# Patient Record
Sex: Female | Born: 1965 | Race: White | Hispanic: No | Marital: Married | Smoking: Never smoker
Health system: Southern US, Community
[De-identification: ages and names within clinical notes are randomized; demographics above are authoritative.]

## PROBLEM LIST (undated history)

## (undated) DIAGNOSIS — I10 Essential (primary) hypertension: Secondary | ICD-10-CM

## (undated) DIAGNOSIS — E079 Disorder of thyroid, unspecified: Secondary | ICD-10-CM

## (undated) DIAGNOSIS — E119 Type 2 diabetes mellitus without complications: Secondary | ICD-10-CM

## (undated) DIAGNOSIS — F988 Other specified behavioral and emotional disorders with onset usually occurring in childhood and adolescence: Secondary | ICD-10-CM

## (undated) DIAGNOSIS — E7800 Pure hypercholesterolemia, unspecified: Secondary | ICD-10-CM

## (undated) HISTORY — DX: Type 2 diabetes mellitus without complications: E11.9

## (undated) HISTORY — DX: Other specified behavioral and emotional disorders with onset usually occurring in childhood and adolescence: F98.8

## (undated) HISTORY — DX: Essential (primary) hypertension: I10

## (undated) HISTORY — PX: CERVIX SURGERY: SHX593

## (undated) HISTORY — PX: EYE SURGERY: SHX253

## (undated) HISTORY — PX: COLONOSCOPY: SHX174

## (undated) HISTORY — PX: BREAST SURGERY: SHX581

---

## 1998-01-28 ENCOUNTER — Other Ambulatory Visit: Admission: RE | Admit: 1998-01-28 | Discharge: 1998-01-28 | Payer: Self-pay | Admitting: Obstetrics and Gynecology

## 1999-02-12 ENCOUNTER — Other Ambulatory Visit: Admission: RE | Admit: 1999-02-12 | Discharge: 1999-02-12 | Payer: Self-pay | Admitting: Obstetrics and Gynecology

## 2000-03-03 ENCOUNTER — Other Ambulatory Visit: Admission: RE | Admit: 2000-03-03 | Discharge: 2000-03-03 | Payer: Self-pay | Admitting: Obstetrics and Gynecology

## 2000-10-18 ENCOUNTER — Encounter: Admission: RE | Admit: 2000-10-18 | Discharge: 2000-11-23 | Payer: Self-pay | Admitting: Obstetrics and Gynecology

## 2001-01-09 ENCOUNTER — Inpatient Hospital Stay (HOSPITAL_COMMUNITY): Admission: AD | Admit: 2001-01-09 | Discharge: 2001-01-09 | Payer: Self-pay | Admitting: *Deleted

## 2001-01-12 ENCOUNTER — Inpatient Hospital Stay (HOSPITAL_COMMUNITY): Admission: AD | Admit: 2001-01-12 | Discharge: 2001-01-14 | Payer: Self-pay | Admitting: Obstetrics and Gynecology

## 2001-01-26 ENCOUNTER — Encounter: Admission: RE | Admit: 2001-01-26 | Discharge: 2001-02-25 | Payer: Self-pay | Admitting: Obstetrics and Gynecology

## 2001-02-26 ENCOUNTER — Encounter: Admission: RE | Admit: 2001-02-26 | Discharge: 2001-03-28 | Payer: Self-pay | Admitting: Obstetrics and Gynecology

## 2001-02-28 ENCOUNTER — Other Ambulatory Visit: Admission: RE | Admit: 2001-02-28 | Discharge: 2001-02-28 | Payer: Self-pay | Admitting: Obstetrics and Gynecology

## 2001-04-28 ENCOUNTER — Encounter: Admission: RE | Admit: 2001-04-28 | Discharge: 2001-05-28 | Payer: Self-pay | Admitting: Obstetrics and Gynecology

## 2001-05-29 ENCOUNTER — Encounter: Admission: RE | Admit: 2001-05-29 | Discharge: 2001-06-28 | Payer: Self-pay | Admitting: Obstetrics and Gynecology

## 2002-04-09 ENCOUNTER — Other Ambulatory Visit: Admission: RE | Admit: 2002-04-09 | Discharge: 2002-04-09 | Payer: Self-pay | Admitting: Obstetrics and Gynecology

## 2002-12-25 ENCOUNTER — Encounter: Admission: RE | Admit: 2002-12-25 | Discharge: 2003-03-25 | Payer: Self-pay | Admitting: Obstetrics and Gynecology

## 2003-04-23 ENCOUNTER — Other Ambulatory Visit: Admission: RE | Admit: 2003-04-23 | Discharge: 2003-04-23 | Payer: Self-pay | Admitting: Obstetrics and Gynecology

## 2004-05-13 ENCOUNTER — Other Ambulatory Visit: Admission: RE | Admit: 2004-05-13 | Discharge: 2004-05-13 | Payer: Self-pay | Admitting: Obstetrics and Gynecology

## 2005-05-31 ENCOUNTER — Other Ambulatory Visit: Admission: RE | Admit: 2005-05-31 | Discharge: 2005-05-31 | Payer: Self-pay | Admitting: Obstetrics and Gynecology

## 2012-01-29 ENCOUNTER — Encounter (HOSPITAL_COMMUNITY): Payer: Self-pay | Admitting: *Deleted

## 2012-01-29 ENCOUNTER — Emergency Department (INDEPENDENT_AMBULATORY_CARE_PROVIDER_SITE_OTHER)
Admission: EM | Admit: 2012-01-29 | Discharge: 2012-01-29 | Disposition: A | Discharge status: Home / Self Care | Payer: Managed Care, Other (non HMO) | Source: Home / Self Care | Attending: Emergency Medicine | Admitting: Emergency Medicine

## 2012-01-29 DIAGNOSIS — K122 Cellulitis and abscess of mouth: Secondary | ICD-10-CM

## 2012-01-29 HISTORY — DX: Pure hypercholesterolemia, unspecified: E78.00

## 2012-01-29 HISTORY — DX: Disorder of thyroid, unspecified: E07.9

## 2012-01-29 MED ORDER — ACETAMINOPHEN-CODEINE #3 300-30 MG PO TABS: 1.0000 | ORAL_TABLET | Freq: Four times a day (QID) | ORAL | Status: DC | PRN

## 2012-01-29 MED ORDER — PREDNISONE 10 MG PO TABS: 20.0000 mg | ORAL_TABLET | Freq: Every day | ORAL | Status: AC

## 2012-01-29 MED ORDER — AMOXICILLIN-POT CLAVULANATE 500-125 MG PO TABS: 1.0000 | ORAL_TABLET | Freq: Three times a day (TID) | ORAL | Status: AC

## 2012-01-29 NOTE — ED Provider Notes (Signed)
History     CSN: [id number]  Arrival date & time 01/29/12  1721   First MD Initiated Contact with Patient 01/29/12 1740      Chief Complaint  Patient presents with  . Sore Throat    (Consider location/radiation/quality/duration/timing/severity/associated sxs/prior treatment) HPI Comments: Pt  Reports  Symptoms  Of  sorethroat  With  Pain on  Swallowing  As  Well  As  What  She  descibed  As  Sores  On uvula . Patient was trying with salt water gargles along with peroxide. Put pain seemed to have gotten worse in the last 2 days. She was told that this could be a streptococcal infection. Although patient describes that she has not had any fevers is not congested and does not have of any "lymph nodes on her neck".  Patient is a 46 y.o. female presenting with pharyngitis. The history is provided by the patient.  Sore Throat This is a new problem. The current episode started more than 2 days ago. The problem occurs constantly. The problem has been gradually worsening. Pertinent negatives include no abdominal pain, no headaches and no shortness of breath. The symptoms are aggravated by swallowing. Nothing relieves the symptoms. Treatments tried: Patient was trying salt water gargles along with some peroxide.    Past Medical History  Diagnosis Date  . Thyroid disorder   . Hypercholesterolemia     Past Surgical History  Procedure Date  . Cervix surgery     No family history on file.  History  Substance Use Topics  . Smoking status: Never Smoker   . Smokeless tobacco: Not on file  . Alcohol Use: No    OB History    Grav Para Term Preterm Abortions TAB SAB Ect Mult Living                  Review of Systems  Constitutional: Negative for fever, chills, activity change, appetite change and fatigue.  HENT: Positive for sore throat and trouble swallowing. Negative for ear pain, congestion, rhinorrhea, neck pain, postnasal drip, sinus pressure and ear discharge.   Respiratory:  Negative for shortness of breath.   Gastrointestinal: Negative for nausea and abdominal pain.  Musculoskeletal: Negative for myalgias and arthralgias.  Skin: Negative for rash.  Neurological: Negative for headaches.    Allergies  Review of patient's allergies indicates not on file.  Home Medications   Current Outpatient Rx  Name Route Sig Dispense Refill  . LIPITOR PO Oral Take by mouth.    . SYNTHROID PO Oral Take by mouth.    . ACETAMINOPHEN-CODEINE #3 300-30 MG PO TABS Oral Take 1-2 tablets by mouth every 6 (six) hours as needed for pain. 15 tablet 0  . AMOXICILLIN-POT CLAVULANATE 500-125 MG PO TABS Oral Take 1 tablet (500 mg total) by mouth 3 (three) times daily. 30 tablet 0  . PREDNISONE 10 MG PO TABS Oral Take 2 tablets (20 mg total) by mouth daily. 15 tablet 0    BP 150/99  Pulse 108  Temp 98.8 F (37.1 C) (Oral)  Resp 20  SpO2 100%  Physical Exam  Nursing note and vitals reviewed. Constitutional: She appears well-developed and well-nourished. No distress.  HENT:  Right Ear: Tympanic membrane normal.  Left Ear: Tympanic membrane normal.  Mouth/Throat: Mucous membranes are normal. Uvula swelling present. Posterior oropharyngeal erythema present. No oropharyngeal exudate or tonsillar abscesses.    Eyes: Conjunctivae normal are normal.  Neck: Neck supple. No JVD present. No thyromegaly present.  Abdominal: Soft. There is no tenderness.  Lymphadenopathy:    She has no cervical adenopathy.  Neurological: She is alert.  Skin: No rash noted. No erythema.    ED Course  Procedures (including critical care time)   Labs Reviewed  POCT RAPID STREP A ([facility] ONLY)   No results found.   1. Uvulitis       MDM   Uvulitis patient was prescribed Augmentin, prednisone and Tylenol #3 for pain and discomfort. Otherwise to return in 3 days if no significant improvement we also discuss symptoms that should warrant her more probably evaluation return patient agrees  with treatment plan and followup care      Jimmie Molly, MD 01/29/12 2021

## 2012-01-29 NOTE — ED Notes (Signed)
Pt  Reports  Symptoms  Of  sorethroat  With  Pain on  Swallowing  As  Well  As  What  She  descibed  As  Sores  On uvula     She  Is  Sitting  Upright on  Exam table  She  Is  Speaking in  Complete  sentances  And  Is  In no  Severe  Distress

## 2012-08-28 ENCOUNTER — Encounter: Payer: Self-pay | Admitting: Family Medicine

## 2012-08-28 ENCOUNTER — Ambulatory Visit (INDEPENDENT_AMBULATORY_CARE_PROVIDER_SITE_OTHER): Payer: 59 | Admitting: Family Medicine

## 2012-08-28 VITALS — BP 140/100 | HR 92 | Temp 98.5°F | Resp 16 | Ht 63.5 in | Wt 222.0 lb

## 2012-08-28 DIAGNOSIS — E785 Hyperlipidemia, unspecified: Secondary | ICD-10-CM

## 2012-08-28 DIAGNOSIS — I10 Essential (primary) hypertension: Secondary | ICD-10-CM | POA: Insufficient documentation

## 2012-08-28 DIAGNOSIS — E039 Hypothyroidism, unspecified: Secondary | ICD-10-CM

## 2012-08-28 DIAGNOSIS — E079 Disorder of thyroid, unspecified: Secondary | ICD-10-CM | POA: Insufficient documentation

## 2012-08-28 LAB — BASIC METABOLIC PANEL
BUN: 12 mg/dL (ref 6–23)
CO2: 28 mEq/L (ref 19–32)
Chloride: 100 mEq/L (ref 96–112)
Creat: 0.75 mg/dL (ref 0.50–1.10)
Glucose, Bld: 104 mg/dL — ABNORMAL HIGH (ref 70–99)

## 2012-08-28 LAB — HEPATIC FUNCTION PANEL
ALT: 40 U/L — ABNORMAL HIGH (ref 0–35)
AST: 21 U/L (ref 0–37)
Total Protein: 7.4 g/dL (ref 6.0–8.3)

## 2012-08-28 LAB — LIPID PANEL
Cholesterol: 234 mg/dL — ABNORMAL HIGH (ref 0–200)
Triglycerides: 270 mg/dL — ABNORMAL HIGH (ref <150)

## 2012-08-28 MED ORDER — LOSARTAN POTASSIUM-HCTZ 50-12.5 MG PO TABS: 1.0000 | ORAL_TABLET | Freq: Every day | ORAL | Status: DC

## 2012-08-28 NOTE — Progress Notes (Signed)
Subjective:    Patient ID: Samantha Glover, female    DOB: Jun 11, 1965, 47 y.o.   MRN: [id number]  HPI Patient is here today to establish. She is a very pleasant female. She normally sees Dr. Arelia Sneddon, her gynecologist. He discovered her blood pressure was significantly elevated at 150/90. He started her on hydrochlorothiazide 12.5 mg by mouth daily. She's been taking this for over a month. Her blood pressures continue to remain elevated at 140/100. She denies any chest pain or shortness of breath. She does report significant fatigue. She's recently gained a lot of weight. She snores very loudly. She is always tired. She falls asleep very easily throughout the day. Her husband has witnessed an occasional apneic episode.  She has had a history of hypothyroidism and hyperlipidemia. However she has not been taking the Lipitor and levothyroxine her previous PCP had prescribed. Past Medical History  Diagnosis Date  . Hypercholesterolemia   . Hypertension   . Thyroid disorder     hypothyroidism   Current outpatient prescriptions:hydrochlorothiazide (MICROZIDE) 12.5 MG capsule, Take 12.5 mg by mouth daily., Disp: , Rfl: ;  triamcinolone (NASACORT) 55 MCG/ACT nasal inhaler, Place 2 sprays into the nose daily., Disp: , Rfl: ;  losartan-hydrochlorothiazide (HYZAAR) 50-12.5 MG per tablet, Take 1 tablet by mouth daily., Disp: 30 tablet, Rfl: 5  No Known Allergies History   Social History  . Marital Status: Married    Spouse Name: N/A    Number of Children: N/A  . Years of Education: N/A   Occupational History  . Not on file.   Social History Main Topics  . Smoking status: Former Games developer  . Smokeless tobacco: Never Used  . Alcohol Use: No  . Drug Use: No  . Sexually Active: Not on file     Comment: married.  2 sons.  Currently, stay at home mom.   Other Topics Concern  . Not on file   Social History Narrative  . No narrative on file   Family History  Problem Relation Age of Onset  .  Hyperlipidemia Mother   . Hypertension Mother   . Hyperlipidemia Sister   . Hypertension Sister   . Hyperlipidemia Sister   . Hypertension Sister        Review of Systems Review of systems is otherwise    Objective:   Physical Exam  Constitutional: She is oriented to person, place, and time. She appears well-developed and well-nourished.  HENT:  Head: Normocephalic.  Mouth/Throat: Oropharynx is clear and moist.  Neck: Neck supple. No JVD present. No thyromegaly present.  Cardiovascular: Normal rate, regular rhythm, normal heart sounds and intact distal pulses.  Exam reveals no gallop.   No murmur heard. Pulmonary/Chest: Effort normal and breath sounds normal. No respiratory distress. She has no wheezes. She has no rales. She exhibits no tenderness.  Abdominal: Soft. Bowel sounds are normal. She exhibits no distension and no mass. There is no tenderness. There is no rebound and no guarding.  Lymphadenopathy:    She has no cervical adenopathy.  Neurological: She is alert and oriented to person, place, and time. No cranial nerve deficit. She exhibits normal muscle tone. Coordination normal.  Skin: No rash noted. No erythema.  Psychiatric: She has a normal mood and affect. Her behavior is normal. Judgment and thought content normal.          Assessment & Plan:  1. HTN (hypertension) Discontinue hydrochlorothiazide. Begin Hyzaar 50/12.5 one by mouth daily. Recheck blood pressure in one month along with  a BMP to evaluate for subclinical renal artery stenosis.  I strongly recommended a sleep study to evaluate for possible obstructive sleep apnea. The patient will discuss with the family, consider, and let me know her wishes when I call her with her laboratory values. - losartan-hydrochlorothiazide (HYZAAR) 50-12.5 MG per tablet; Take 1 tablet by mouth daily.  Dispense: 30 tablet; Refill: 5 - Basic Metabolic Panel - Hepatic Function Panel - Lipid Panel  2. Unspecified  hypothyroidism Given her obesity and fatigue, check TSH - TSH  3. HLD (hyperlipidemia) Go LDL is less than 130

## 2012-08-29 MED ORDER — ATORVASTATIN CALCIUM 20 MG PO TABS: 20.0000 mg | ORAL_TABLET | Freq: Every day | ORAL | Status: DC

## 2012-08-29 NOTE — Addendum Note (Signed)
Addended by: Lynnea Ferrier on: 08/29/2012 07:34 AM   Modules accepted: Orders

## 2012-08-31 ENCOUNTER — Encounter: Payer: Self-pay | Admitting: Family Medicine

## 2012-08-31 NOTE — Telephone Encounter (Signed)
This encounter was created in error - please disregard.

## 2012-11-14 ENCOUNTER — Other Ambulatory Visit: Payer: Self-pay | Admitting: Family Medicine

## 2012-11-14 DIAGNOSIS — Z79899 Other long term (current) drug therapy: Secondary | ICD-10-CM

## 2012-11-14 DIAGNOSIS — E782 Mixed hyperlipidemia: Secondary | ICD-10-CM

## 2012-11-20 ENCOUNTER — Other Ambulatory Visit: Payer: BC Managed Care – PPO

## 2012-11-20 DIAGNOSIS — R7309 Other abnormal glucose: Secondary | ICD-10-CM

## 2012-11-20 DIAGNOSIS — E782 Mixed hyperlipidemia: Secondary | ICD-10-CM

## 2012-11-20 DIAGNOSIS — Z79899 Other long term (current) drug therapy: Secondary | ICD-10-CM

## 2012-11-20 LAB — COMPREHENSIVE METABOLIC PANEL
AST: 18 U/L (ref 0–37)
Albumin: 4.2 g/dL (ref 3.5–5.2)
BUN: 10 mg/dL (ref 6–23)
Calcium: 9.2 mg/dL (ref 8.4–10.5)
Chloride: 102 mEq/L (ref 96–112)
Glucose, Bld: 173 mg/dL — ABNORMAL HIGH (ref 70–99)
Potassium: 4.2 mEq/L (ref 3.5–5.3)
Sodium: 137 mEq/L (ref 135–145)
Total Protein: 7 g/dL (ref 6.0–8.3)

## 2012-11-20 LAB — LIPID PANEL
HDL: 31 mg/dL — ABNORMAL LOW (ref >39)
LDL Cholesterol: 78 mg/dL (ref 0–99)
Triglycerides: 238 mg/dL — ABNORMAL HIGH (ref <150)

## 2012-11-21 LAB — HEMOGLOBIN A1C, FINGERSTICK: Hgb A1C (fingerstick): 6.6 % — ABNORMAL HIGH (ref <5.7)

## 2012-11-23 ENCOUNTER — Ambulatory Visit (INDEPENDENT_AMBULATORY_CARE_PROVIDER_SITE_OTHER): Payer: Managed Care, Other (non HMO) | Admitting: Family Medicine

## 2012-11-23 ENCOUNTER — Encounter: Payer: Self-pay | Admitting: Family Medicine

## 2012-11-23 VITALS — BP 122/82 | HR 78 | Temp 98.5°F | Resp 16 | Wt 228.0 lb

## 2012-11-23 DIAGNOSIS — I10 Essential (primary) hypertension: Secondary | ICD-10-CM

## 2012-11-23 DIAGNOSIS — E785 Hyperlipidemia, unspecified: Secondary | ICD-10-CM

## 2012-11-23 DIAGNOSIS — E7800 Pure hypercholesterolemia, unspecified: Secondary | ICD-10-CM | POA: Insufficient documentation

## 2012-11-23 DIAGNOSIS — E119 Type 2 diabetes mellitus without complications: Secondary | ICD-10-CM | POA: Insufficient documentation

## 2012-11-23 MED ORDER — LOSARTAN POTASSIUM 50 MG PO TABS: 50.0000 mg | ORAL_TABLET | Freq: Every day | ORAL | Status: DC

## 2012-11-23 NOTE — Progress Notes (Signed)
Subjective:    Patient ID: Samantha Glover, female    DOB: 1966/01/26, 47 y.o.   MRN: [id number]  HPI Patient is here today for followup of her hypertension and her hyperlipidemia. Currently she is taking Hyzaar 50/12.5 one by mouth daily for hypertension. Her blood pressure is well-controlled at 122/82. She is also taking Lipitor 20 mg by mouth daily for her cholesterol. Her most recent lab work is listed below. She denies chest pain, shortness of breath, dyspnea on exertion, right upper quadrant pain, or myalgias. Past Medical History  Diagnosis Date  . Diabetes mellitus without complication   . Hypercholesterolemia   . Hypertension   . Thyroid disorder     hypothyroidism   Current Outpatient Prescriptions on File Prior to Visit  Medication Sig Dispense Refill  . atorvastatin (LIPITOR) 20 MG tablet Take 1 tablet (20 mg total) by mouth daily.  90 tablet  3  . triamcinolone (NASACORT) 55 MCG/ACT nasal inhaler Place 2 sprays into the nose daily.       No current facility-administered medications on file prior to visit.   No Known Allergies History   Social History  . Marital Status: Married    Spouse Name: N/A    Number of Children: N/A  . Years of Education: N/A   Occupational History  . Not on file.   Social History Main Topics  . Smoking status: Former Games developer  . Smokeless tobacco: Never Used  . Alcohol Use: No  . Drug Use: No  . Sexually Active: Not on file     Comment: married.  2 sons.  Currently, stay at home mom.   Other Topics Concern  . Not on file   Social History Narrative  . No narrative on file   Lab on 11/20/2012  Component Date Value Range Status  . Sodium 11/20/2012 137  135 - 145 mEq/L Final  . Potassium 11/20/2012 4.2  3.5 - 5.3 mEq/L Final  . Chloride 11/20/2012 102  96 - 112 mEq/L Final  . CO2 11/20/2012 25  19 - 32 mEq/L Final  . Glucose, Bld 11/20/2012 173* 70 - 99 mg/dL Final  . BUN 40/98/1191 10  6 - 23 mg/dL Final  . Creat 47/82/9562 0.68   0.50 - 1.10 mg/dL Final  . Total Bilirubin 11/20/2012 0.5  0.3 - 1.2 mg/dL Final  . Alkaline Phosphatase 11/20/2012 68  39 - 117 U/L Final  . AST 11/20/2012 18  0 - 37 U/L Final  . ALT 11/20/2012 32  0 - 35 U/L Final  . Total Protein 11/20/2012 7.0  6.0 - 8.3 g/dL Final  . Albumin 13/11/6576 4.2  3.5 - 5.2 g/dL Final  . Calcium 46/96/2952 9.2  8.4 - 10.5 mg/dL Final  . Cholesterol 84/13/2440 157  0 - 200 mg/dL Final   Comment: ATP III Classification:                                < 200        mg/dL        Desirable                               200 - 239     mg/dL        Borderline High                               >=  240        mg/dL        High                             . Triglycerides 11/20/2012 238* <150 mg/dL Final  . HDL 14/78/2956 31* >39 mg/dL Final  . Total CHOL/HDL Ratio 11/20/2012 5.1   Final  . VLDL 11/20/2012 48* 0 - 40 mg/dL Final  . LDL Cholesterol 11/20/2012 78  0 - 99 mg/dL Final   Comment:                            Total Cholesterol/HDL Ratio:CHD Risk                                                 Coronary Heart Disease Risk Table                                                                 Men       Women                                   1/2 Average Risk              3.4        3.3                                       Average Risk              5.0        4.4                                    2X Average Risk              9.6        7.1                                    3X Average Risk             23.4       11.0                          Use the calculated Patient Ratio above and the CHD Risk table                           to determine the patient's CHD Risk.                          ATP III Classification (LDL):                                <  100        mg/dL         Optimal                               100 - 129     mg/dL         Near or Above Optimal                               130 - 159     mg/dL         Borderline High                                160 - 189     mg/dL         High                                > 190        mg/dL         Very High                             . Hgb A1C (fingerstick) 11/20/2012 6.6* <5.7 % Final   Comment:                                                                                                 According to the ADA Clinical Practice Recommendations for 2011, when                          HbA1c is used as a screening test:                                                       >=6.5%   Diagnostic of Diabetes Mellitus                                     (if abnormal result is confirmed)                                                     5.7-6.4%   Increased risk of developing Diabetes Mellitus  References:Diagnosis and Classification of Diabetes Mellitus,Diabetes                          Care,2011,34(Suppl 1):S62-S69 and Standards of Medical Care in                                  Diabetes - 2011,Diabetes Care,2011,34 (Suppl 1):S11-S61.                              Labs continued 2 showed elevated triglycerides at 238, low HDL at 31, and unfortunately a hemoglobin A1c of 6.6 with a fasting blood sugar of 178.   Review of Systems  All other systems reviewed and are negative.       Objective:   Physical Exam  Vitals reviewed. Neck: Neck supple. No thyromegaly present.  Cardiovascular: Normal rate, regular rhythm, normal heart sounds and intact distal pulses.   No murmur heard. Pulmonary/Chest: Effort normal and breath sounds normal. No respiratory distress. She has no wheezes. She has no rales.  Abdominal: Soft. Bowel sounds are normal. She exhibits no distension and no mass. There is no tenderness. There is no rebound and no guarding.          Assessment & Plan:  1. Type II or unspecified type diabetes mellitus without mention of complication, not stated as uncontrolled This is a new diagnosis.  Spent approximately 20 minutes  discussing diet, lifestyle, exercise, and implications of her lab work. She will begin a low carbohydrate diet and gradually increase aerobic exercise. I am going to set her up to see a nutritionist for diabetic education. We will recheck a fasting lipid panel and hemoglobin A1c in 3 months the  2. HTN (hypertension) Blood pressure is well controlled. However given her elevated triglycerides as well as her elevated blood sugar, I will discontinue the hydrochlorothiazide portion of Hyzaar. Begin losartan 50 mg by mouth daily  3. HLD (hyperlipidemia) Work on diet to address triglycerides. We did discuss the possible increase in her blood sugar due to the Lipitor.  I feel the effect is likely small. Recheck labs in 3 months. If sugars continue to increase we may want to discontinue Lipitor.

## 2012-12-21 ENCOUNTER — Encounter: Payer: Managed Care, Other (non HMO) | Attending: Family Medicine

## 2012-12-21 VITALS — Ht 63.0 in | Wt 220.8 lb

## 2012-12-21 DIAGNOSIS — Z713 Dietary counseling and surveillance: Secondary | ICD-10-CM | POA: Insufficient documentation

## 2012-12-21 DIAGNOSIS — E119 Type 2 diabetes mellitus without complications: Secondary | ICD-10-CM

## 2012-12-21 NOTE — Patient Instructions (Signed)
Goals:  Follow Diabetes Meal Plan as instructed  Eat 3 meals and 2 snacks, every 3-5 hrs  Limit carbohydrate intake to 30-45 grams carbohydrate/meal  Limit carbohydrate intake to 15 grams carbohydrate/snack  Add lean protein foods to meals/snacks  Monitor glucose levels as instructed by your doctor  Aim for 30 mins of physical activity daily  Bring food record and glucose log to your next nutrition visit 

## 2012-12-21 NOTE — Progress Notes (Signed)
Patient was seen on 12/21/12 for the first of a series of three diabetes self-management courses at the [facility].   Current HbA1c: 6.6%  The following learning objectives were met by the patient during this course:   Defines the role of glucose and insulin  Identifies type of diabetes and pathophysiology  Defines the diagnostic criteria for diabetes and prediabetes  States the risk factors for Type 2 Diabetes  States the symptoms of Type 2 Diabetes  Defines Type 2 Diabetes treatment goals  Defines Type 2 Diabetes treatment options  States the rationale for glucose monitoring  Identifies A1C, glucose targets, and testing times  Identifies proper sharps disposal  Defines the purpose of a diabetes food plan  Identifies carbohydrate food groups  Defines effects of carbohydrate foods on glucose levels  Identifies carbohydrate choices/grams/food labels  States benefits of physical activity and effect on glucose  Review of suggested activity guidelines  Handouts given during class include:  Type 2 Diabetes: Basics Book  My Food Plan Book  Food and Activity Log  Your patient has identified their diabetes self-care support plan as:  [facility] support group  Follow-Up Plan: Attend core 2 and core 3   

## 2013-02-23 ENCOUNTER — Ambulatory Visit: Payer: BC Managed Care – PPO | Admitting: Family Medicine

## 2013-02-27 ENCOUNTER — Ambulatory Visit (INDEPENDENT_AMBULATORY_CARE_PROVIDER_SITE_OTHER): Payer: BC Managed Care – PPO | Admitting: Family Medicine

## 2013-02-27 ENCOUNTER — Encounter: Payer: Self-pay | Admitting: Family Medicine

## 2013-02-27 VITALS — BP 126/90 | HR 88 | Temp 98.1°F | Resp 18 | Wt 226.0 lb

## 2013-02-27 DIAGNOSIS — E119 Type 2 diabetes mellitus without complications: Secondary | ICD-10-CM

## 2013-02-27 DIAGNOSIS — E785 Hyperlipidemia, unspecified: Secondary | ICD-10-CM

## 2013-02-27 DIAGNOSIS — I10 Essential (primary) hypertension: Secondary | ICD-10-CM

## 2013-02-27 NOTE — Progress Notes (Signed)
Subjective:    Patient ID: Samantha Glover, female    DOB: [birth year], 47 y.o.   MRN: [id number]  HPI 11/23/12 Patient is here today for followup of her hypertension and her hyperlipidemia. Currently she is taking Hyzaar 50/12.5 one by mouth daily for hypertension. Her blood pressure is well-controlled at 122/82. She is also taking Lipitor 20 mg by mouth daily for her cholesterol. Her most recent lab work is listed below. She denies chest pain, shortness of breath, dyspnea on exertion, right upper quadrant pain, or myalgias. Past Medical History  Diagnosis Date  . Diabetes mellitus without complication   . Hypercholesterolemia   . Hypertension   . Thyroid disorder     hypothyroidism   Current Outpatient Prescriptions on File Prior to Visit  Medication Sig Dispense Refill  . atorvastatin (LIPITOR) 20 MG tablet Take 1 tablet (20 mg total) by mouth daily.  90 tablet  3  . cetirizine (ZYRTEC) 10 MG tablet Take 10 mg by mouth daily.      Marland Kitchen losartan (COZAAR) 50 MG tablet Take 1 tablet (50 mg total) by mouth daily.  30 tablet  5  . triamcinolone (NASACORT) 55 MCG/ACT nasal inhaler Place 2 sprays into the nose daily.      . vitamin B-12 (CYANOCOBALAMIN) 500 MCG tablet Take 500 mcg by mouth daily.      . Vitamin D, Ergocalciferol, (DRISDOL) 50000 UNITS CAPS capsule Take 50,000 Units by mouth.       No current facility-administered medications on file prior to visit.   No Known Allergies History   Social History  . Marital Status: Married    Spouse Name: N/A    Number of Children: N/A  . Years of Education: N/A   Occupational History  . Not on file.   Social History Main Topics  . Smoking status: Former Games developer  . Smokeless tobacco: Never Used  . Alcohol Use: No  . Drug Use: No  . Sexual Activity: Not on file     Comment: married.  2 sons.  Currently, stay at home mom.   Other Topics Concern  . Not on file   Social History Narrative  . No narrative on file   No visits with  results within 1 Month(s) from this visit. Latest known visit with results is:  Lab on 11/20/2012  Component Date Value Range Status  . Sodium 11/20/2012 137  135 - 145 mEq/L Final  . Potassium 11/20/2012 4.2  3.5 - 5.3 mEq/L Final  . Chloride 11/20/2012 102  96 - 112 mEq/L Final  . CO2 11/20/2012 25  19 - 32 mEq/L Final  . Glucose, Bld 11/20/2012 173* 70 - 99 mg/dL Final  . BUN 78/29/5621 10  6 - 23 mg/dL Final  . Creat 30/86/5784 0.68  0.50 - 1.10 mg/dL Final  . Total Bilirubin 11/20/2012 0.5  0.3 - 1.2 mg/dL Final  . Alkaline Phosphatase 11/20/2012 68  39 - 117 U/L Final  . AST 11/20/2012 18  0 - 37 U/L Final  . ALT 11/20/2012 32  0 - 35 U/L Final  . Total Protein 11/20/2012 7.0  6.0 - 8.3 g/dL Final  . Albumin 69/62/9528 4.2  3.5 - 5.2 g/dL Final  . Calcium 41/32/4401 9.2  8.4 - 10.5 mg/dL Final  . Cholesterol 02/72/5366 157  0 - 200 mg/dL Final   Comment: ATP III Classification:                                <  200        mg/dL        Desirable                               200 - 239     mg/dL        Borderline High                               >= 240        mg/dL        High                             . Triglycerides 11/20/2012 238* <150 mg/dL Final  . HDL 16/01/9603 31* >39 mg/dL Final  . Total CHOL/HDL Ratio 11/20/2012 5.1   Final  . VLDL 11/20/2012 48* 0 - 40 mg/dL Final  . LDL Cholesterol 11/20/2012 78  0 - 99 mg/dL Final   Comment:                            Total Cholesterol/HDL Ratio:CHD Risk                                                 Coronary Heart Disease Risk Table                                                                 Men       Women                                   1/2 Average Risk              3.4        3.3                                       Average Risk              5.0        4.4                                    2X Average Risk              9.6        7.1                                    3X Average Risk             23.4       11.0  Use the calculated Patient Ratio above and the CHD Risk table                           to determine the patient's CHD Risk.                          ATP III Classification (LDL):                                < 100        mg/dL         Optimal                               100 - 129     mg/dL         Near or Above Optimal                               130 - 159     mg/dL         Borderline High                               160 - 189     mg/dL         High                                > 190        mg/dL         Very High                             . Hgb A1C (fingerstick) 11/20/2012 6.6* <5.7 % Final   Comment:                                                                                                 According to the ADA Clinical Practice Recommendations for 2011, when                          HbA1c is used as a screening test:                                                       >=6.5%   Diagnostic of Diabetes Mellitus                                     (if abnormal result is confirmed)  5.7-6.4%   Increased risk of developing Diabetes Mellitus                                                     References:Diagnosis and Classification of Diabetes Mellitus,Diabetes                          Care,2011,34(Suppl 1):S62-S69 and Standards of Medical Care in                                  Diabetes - 2011,Diabetes Care,2011,34 (Suppl 1):S11-S61.                              Labs continued 2 showed elevated triglycerides at 238, low HDL at 31, and unfortunately a hemoglobin A1c of 6.6 with a fasting blood sugar of 178.  At that time, my plan was: 1. Type II or unspecified type diabetes mellitus without mention of complication, not stated as uncontrolled This is a new diagnosis.  Spent approximately 20 minutes discussing diet, lifestyle, exercise, and implications of her lab work. She will begin a low carbohydrate  diet and gradually increase aerobic exercise. I am going to set her up to see a nutritionist for diabetic education. We will recheck a fasting lipid panel and hemoglobin A1c in 3 months the  2. HTN (hypertension) Blood pressure is well controlled. However given her elevated triglycerides as well as her elevated blood sugar, I will discontinue the hydrochlorothiazide portion of Hyzaar. Begin losartan 50 mg by mouth daily  3. HLD (hyperlipidemia) Work on diet to address triglycerides. We did discuss the possible increase in her blood sugar due to the Lipitor.  I feel the effect is likely small. Recheck labs in 3 months. If sugars continue to increase we may want to discontinue Lipitor.  02/27/13 She is here today for follow up.  The patient did really well initially. She lost 10 pounds. Unfortunately she becomes lax in her diet and has gained 10 pounds back. She is not exercising. She thinks that she is having difficult time concentrating. 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 She finds that she is having to reread the same material while studying. She thinks this could be due to her cholesterol medicine. She denies any problems with focus or attention in high school. She is interested in discontinuing the cholesterol medicine. Her blood pressure today is borderline at 126/90. She is not been checking her blood sugar.  Review of Systems  All other systems reviewed and are negative.       Objective:   Physical Exam  Vitals reviewed. Neck: Neck supple. No thyromegaly present.  Cardiovascular: Normal rate, regular rhythm, normal heart sounds and intact distal pulses.   No murmur heard. Pulmonary/Chest: Effort normal and breath sounds normal. No respiratory distress. She has no wheezes. She has no rales.  Abdominal: Soft. Bowel sounds are normal. She exhibits no distension and no mass. There is no tenderness. There is no rebound and no guarding.          Assessment & Plan:  1. Type  II or unspecified type diabetes mellitus without mention of complication, not stated as uncontrolled Spent 25  minutes and total patient discussing diet exercise and weight loss. I recommended 30 minutes of aerobic exercise on a daily basis. I recommended a low carbohydrate diet. I recommended that the patient wait herself on a scale every day as a means to motivate her to perform these lifestyle changes and also as a means to "convict" her when her diet has become lax.  Actually believe that with 15 pounds of weight loss her diabetes we will controlled. - Hemoglobin A1c  2. HTN (hypertension) Pressure is borderline. I recommended 15 pounds of weight loss and I anticipate her diastolic blood pressure will improve - COMPLETE METABOLIC PANEL WITH GFR - Lipid panel  3. HLD (hyperlipidemia) Check fasting lipid panel. Goal LDL is less than 100. I am okay with her temporarily discontinuing Lipitor to see if her focus will improve. If he does not, I believe the patient may have mild ADD and I recommend resuming the Lipitor. - Lipid panel

## 2013-02-28 LAB — COMPLETE METABOLIC PANEL WITH GFR
ALT: 34 U/L (ref 0–35)
AST: 23 U/L (ref 0–37)
Albumin: 4.4 g/dL (ref 3.5–5.2)
CO2: 27 mEq/L (ref 19–32)
Calcium: 10 mg/dL (ref 8.4–10.5)
Chloride: 101 mEq/L (ref 96–112)
Creat: 0.73 mg/dL (ref 0.50–1.10)
GFR, Est African American: >89 mL/min
Potassium: 4.3 mEq/L (ref 3.5–5.3)

## 2013-02-28 LAB — HEMOGLOBIN A1C: Mean Plasma Glucose: 146 mg/dL — ABNORMAL HIGH (ref <117)

## 2013-02-28 LAB — LIPID PANEL
HDL: 35 mg/dL — ABNORMAL LOW (ref >39)
LDL Cholesterol: 103 mg/dL — ABNORMAL HIGH (ref 0–99)
Total CHOL/HDL Ratio: 5.3 Ratio
VLDL: 48 mg/dL — ABNORMAL HIGH (ref 0–40)

## 2013-03-13 ENCOUNTER — Telehealth: Payer: Self-pay | Admitting: Family Medicine

## 2013-03-13 NOTE — Telephone Encounter (Deleted)
Tamanna is calling today because

## 2013-03-14 NOTE — Telephone Encounter (Signed)
Pt states that since stopping the cholesterol med her "foggy headed sensation" has gotten better but she is still having trouble concentrating.  She said that you had talked about her possible having ADD and was wondering if you would prescribe her something while she is in class????

## 2013-03-15 NOTE — Telephone Encounter (Signed)
I am willing for her to try adderall xr 10 mg poqday but I want to see her back in 1 month to assess its benefits and recheck her BP on med.

## 2013-03-15 NOTE — Telephone Encounter (Signed)
LMTRC

## 2013-03-16 MED ORDER — AMPHETAMINE-DEXTROAMPHET ER 10 MG PO CP24: 10.0000 mg | ORAL_CAPSULE | Freq: Every day | ORAL | Status: DC

## 2013-03-16 NOTE — Telephone Encounter (Signed)
.  Patient aware and will make appt when she comes to pick up rx

## 2013-03-30 ENCOUNTER — Telehealth: Payer: Self-pay | Admitting: Family Medicine

## 2013-03-30 MED ORDER — AZITHROMYCIN 250 MG PO TABS: ORAL_TABLET | ORAL | Status: DC

## 2013-03-30 NOTE — Telephone Encounter (Signed)
I am ok with z-pack

## 2013-03-30 NOTE — Telephone Encounter (Signed)
Med sent to pharmacy and pt aware 

## 2013-03-30 NOTE — Telephone Encounter (Signed)
Pt is wanting to know if Dr Tanya Nones could call her in a zpack I believe is what she said because she is not able to come in today due to scheduling issues Call back number is [phone]

## 2013-04-17 ENCOUNTER — Encounter: Payer: Self-pay | Admitting: Family Medicine

## 2013-04-17 ENCOUNTER — Ambulatory Visit (INDEPENDENT_AMBULATORY_CARE_PROVIDER_SITE_OTHER): Payer: BC Managed Care – PPO | Admitting: Family Medicine

## 2013-04-17 VITALS — BP 104/70 | HR 82 | Temp 97.3°F | Resp 16 | Ht 63.0 in | Wt 222.0 lb

## 2013-04-17 DIAGNOSIS — F988 Other specified behavioral and emotional disorders with onset usually occurring in childhood and adolescence: Secondary | ICD-10-CM | POA: Insufficient documentation

## 2013-04-17 DIAGNOSIS — F909 Attention-deficit hyperactivity disorder, unspecified type: Secondary | ICD-10-CM

## 2013-04-17 DIAGNOSIS — Z23 Encounter for immunization: Secondary | ICD-10-CM

## 2013-04-17 MED ORDER — AMPHETAMINE-DEXTROAMPHET ER 10 MG PO CP24: 20.0000 mg | ORAL_CAPSULE | Freq: Every day | ORAL | Status: DC

## 2013-04-17 NOTE — Progress Notes (Signed)
Subjective:    Patient ID: Samantha Glover, female    DOB: 20-Sep-1965, 47 y.o.   MRN: [id number]  HPI At our last encounter, the patient discussed the issue she is having with focus. She is taking a real estate course. She is finding that she is having an extremely difficult time studying for class, focusing on the professor, completing assignments timely manner, organization, and generally keeping up with the pace of the class. She states this has always been an ENT for her even in high school however due to the strenuous nature of his class he became a pathologic problem. I gave the patient prescription for Adderall 10 mg by mouth daily for presumed attention deficit disorder. The patient did extremely well on the medication.  She found she was able to attend the class, keep pace with the professor, and steady. His able to stay for 10 hours without losing focus. Unfortunately she fell and she became tolerant to the medication after taking it for a month as a 10 mg dose seemed to be less effective. Unfortunately she failed the course. She is going to resume the course January 14. She believes she has the medication for the duration of the course she will be able to focus and steady and passed the course. She has no intention of continuing this medication long-term. Even though she has ADD she says that normally she can cope. She only wants medication to help her through this difficult time. She denies any insomnia, chest pain, palpitations, abdominal pain since starting medication. Past Medical History  Diagnosis Date  . Diabetes mellitus without complication   . Hypercholesterolemia   . Hypertension   . Thyroid disorder     hypothyroidism  . ADD (attention deficit disorder)    Current Outpatient Prescriptions on File Prior to Visit  Medication Sig Dispense Refill  . cetirizine (ZYRTEC) 10 MG tablet Take 10 mg by mouth daily.      Marland Kitchen losartan (COZAAR) 50 MG tablet Take 1 tablet (50 mg total) by  mouth daily.  30 tablet  5  . triamcinolone (NASACORT) 55 MCG/ACT nasal inhaler Place 2 sprays into the nose daily.      . vitamin B-12 (CYANOCOBALAMIN) 500 MCG tablet Take 500 mcg by mouth daily.      . Vitamin D, Ergocalciferol, (DRISDOL) 50000 UNITS CAPS capsule Take 50,000 Units by mouth.      Marland Kitchen atorvastatin (LIPITOR) 20 MG tablet Take 1 tablet (20 mg total) by mouth daily.  90 tablet  3   No current facility-administered medications on file prior to visit.   No Known Allergies History   Social History  . Marital Status: Married    Spouse Name: N/A    Number of Children: N/A  . Years of Education: N/A   Occupational History  . Not on file.   Social History Main Topics  . Smoking status: Former Games developer  . Smokeless tobacco: Never Used  . Alcohol Use: No  . Drug Use: No  . Sexual Activity: Not on file     Comment: married.  2 sons.  Currently, stay at home mom.   Other Topics Concern  . Not on file   Social History Narrative  . No narrative on file      Review of Systems  All other systems reviewed and are negative.       Objective:   Physical Exam  Vitals reviewed. Cardiovascular: Normal rate, regular rhythm and normal heart sounds.   Pulmonary/Chest:  Effort normal and breath sounds normal. No respiratory distress. She has no wheezes. She has no rales.  Psychiatric: She has a normal mood and affect. Her behavior is normal. Judgment and thought content normal.          Assessment & Plan:  1. ADHD (attention deficit hyperactivity disorder) Increase Adderall XR to 20 mg by mouth daily. I will give the patient 10 mg tablets that way she can take one tablet a day on regular school days, and increased to 2 tablets a day on days when she needs to study for prolonged periods of time. The long-term plan is to discontinue the medication after she completes the course. - amphetamine-dextroamphetamine (ADDERALL XR) 10 MG 24 hr capsule; Take 2 capsules (20 mg total) by  mouth daily.  Dispense: 60 capsule; Refill: 0

## 2013-04-17 NOTE — Addendum Note (Signed)
Addended by: Legrand Rams B on: 04/17/2013 11:49 AM   Modules accepted: Orders

## 2013-04-25 ENCOUNTER — Telehealth: Payer: Self-pay | Admitting: Family Medicine

## 2013-04-25 NOTE — Telephone Encounter (Signed)
[facility] sent back form that they did not do PA for this pt so sent in new PA to [facility]

## 2013-04-25 NOTE — Telephone Encounter (Signed)
Submitted PA to [facility] through [email or web address]

## 2013-04-27 ENCOUNTER — Other Ambulatory Visit: Payer: Self-pay | Admitting: Family Medicine

## 2013-04-27 MED ORDER — AMPHETAMINE-DEXTROAMPHET ER 20 MG PO CP24: 20.0000 mg | ORAL_CAPSULE | ORAL | Status: DC

## 2013-04-27 NOTE — Telephone Encounter (Signed)
Her insurance will not cover Adderall XR 10mg  bid - per Dr. Dennard Schaumann change med to Adderal XR 20mg  qd. New rx printed, signed and left up front and pt aware

## 2013-05-30 ENCOUNTER — Other Ambulatory Visit: Payer: Self-pay | Admitting: Family Medicine

## 2013-06-04 ENCOUNTER — Telehealth: Payer: Self-pay | Admitting: Family Medicine

## 2013-06-04 NOTE — Telephone Encounter (Signed)
Pt is calling to see if she can get another refill on her Adderall because she is still taking this class Call back number is [phone]

## 2013-06-05 MED ORDER — AMPHETAMINE-DEXTROAMPHET ER 20 MG PO CP24: 20.0000 mg | ORAL_CAPSULE | ORAL | Status: DC

## 2013-06-05 NOTE — Telephone Encounter (Signed)
ok 

## 2013-06-05 NOTE — Telephone Encounter (Signed)
RX printed, left up front and patient aware to pick up  

## 2013-07-13 ENCOUNTER — Telehealth: Payer: Self-pay | Admitting: Family Medicine

## 2013-07-13 NOTE — Telephone Encounter (Signed)
ok 

## 2013-07-13 NOTE — Telephone Encounter (Signed)
?   OK to Refill  

## 2013-07-13 NOTE — Telephone Encounter (Signed)
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

## 2013-07-16 MED ORDER — AMPHETAMINE-DEXTROAMPHET ER 20 MG PO CP24: 20.0000 mg | ORAL_CAPSULE | ORAL | Status: DC

## 2013-07-16 NOTE — Telephone Encounter (Signed)
RX printed, left up front and patient aware to pick up per vm 

## 2013-09-28 ENCOUNTER — Other Ambulatory Visit: Payer: Self-pay | Admitting: Family Medicine

## 2013-11-03 ENCOUNTER — Other Ambulatory Visit: Payer: Self-pay | Admitting: Family Medicine

## 2013-11-03 DIAGNOSIS — E785 Hyperlipidemia, unspecified: Secondary | ICD-10-CM

## 2013-11-03 DIAGNOSIS — Z79899 Other long term (current) drug therapy: Secondary | ICD-10-CM

## 2013-11-03 DIAGNOSIS — I10 Essential (primary) hypertension: Secondary | ICD-10-CM

## 2013-11-03 DIAGNOSIS — E119 Type 2 diabetes mellitus without complications: Secondary | ICD-10-CM

## 2013-11-19 ENCOUNTER — Other Ambulatory Visit: Payer: BC Managed Care – PPO

## 2013-11-19 DIAGNOSIS — E785 Hyperlipidemia, unspecified: Secondary | ICD-10-CM

## 2013-11-19 DIAGNOSIS — I10 Essential (primary) hypertension: Secondary | ICD-10-CM

## 2013-11-19 DIAGNOSIS — Z79899 Other long term (current) drug therapy: Secondary | ICD-10-CM

## 2013-11-19 DIAGNOSIS — E119 Type 2 diabetes mellitus without complications: Secondary | ICD-10-CM

## 2013-11-19 LAB — COMPREHENSIVE METABOLIC PANEL
ALBUMIN: 4.2 g/dL (ref 3.5–5.2)
ALK PHOS: 75 U/L (ref 39–117)
ALT: 41 U/L — ABNORMAL HIGH (ref 0–35)
AST: 21 U/L (ref 0–37)
BILIRUBIN TOTAL: 0.4 mg/dL (ref 0.2–1.2)
BUN: 10 mg/dL (ref 6–23)
CO2: 27 mEq/L (ref 19–32)
Calcium: 9.3 mg/dL (ref 8.4–10.5)
Chloride: 102 mEq/L (ref 96–112)
Creat: 0.77 mg/dL (ref 0.50–1.10)
GLUCOSE: 129 mg/dL — AB (ref 70–99)
POTASSIUM: 4.4 meq/L (ref 3.5–5.3)
Sodium: 137 mEq/L (ref 135–145)
Total Protein: 7 g/dL (ref 6.0–8.3)

## 2013-11-19 LAB — CBC WITH DIFFERENTIAL/PLATELET
BASOS PCT: 1 % (ref 0–1)
Basophils Absolute: 0.1 10*3/uL (ref 0.0–0.1)
EOS ABS: 1 10*3/uL — AB (ref 0.0–0.7)
Eosinophils Relative: 13 % — ABNORMAL HIGH (ref 0–5)
HEMATOCRIT: 40.7 % (ref 36.0–46.0)
HEMOGLOBIN: 14.2 g/dL (ref 12.0–15.0)
Lymphocytes Relative: 30 % (ref 12–46)
Lymphs Abs: 2.3 10*3/uL (ref 0.7–4.0)
MCH: 30.3 pg (ref 26.0–34.0)
MCHC: 34.9 g/dL (ref 30.0–36.0)
MCV: 86.8 fL (ref 78.0–100.0)
MONO ABS: 0.4 10*3/uL (ref 0.1–1.0)
MONOS PCT: 5 % (ref 3–12)
Neutro Abs: 4 10*3/uL (ref 1.7–7.7)
Neutrophils Relative %: 51 % (ref 43–77)
Platelets: 261 10*3/uL (ref 150–400)
RBC: 4.69 MIL/uL (ref 3.87–5.11)
RDW: 13.2 % (ref 11.5–15.5)
WBC: 7.8 10*3/uL (ref 4.0–10.5)

## 2013-11-19 LAB — HEMOGLOBIN A1C
HEMOGLOBIN A1C: 6.5 % — AB (ref <5.7)
MEAN PLASMA GLUCOSE: 140 mg/dL — AB (ref <117)

## 2013-11-19 LAB — LIPID PANEL
CHOL/HDL RATIO: 4 ratio
CHOLESTEROL: 135 mg/dL (ref 0–200)
HDL: 34 mg/dL — ABNORMAL LOW (ref >39)
LDL Cholesterol: 82 mg/dL (ref 0–99)
Triglycerides: 94 mg/dL (ref <150)
VLDL: 19 mg/dL (ref 0–40)

## 2013-11-22 ENCOUNTER — Encounter: Payer: Self-pay | Admitting: Family Medicine

## 2013-11-22 ENCOUNTER — Ambulatory Visit (INDEPENDENT_AMBULATORY_CARE_PROVIDER_SITE_OTHER): Payer: BC Managed Care – PPO | Admitting: Family Medicine

## 2013-11-22 VITALS — BP 126/90 | HR 86 | Temp 97.9°F | Resp 18 | Ht 63.0 in | Wt 216.0 lb

## 2013-11-22 DIAGNOSIS — E119 Type 2 diabetes mellitus without complications: Secondary | ICD-10-CM

## 2013-11-22 DIAGNOSIS — E785 Hyperlipidemia, unspecified: Secondary | ICD-10-CM

## 2013-11-22 DIAGNOSIS — I10 Essential (primary) hypertension: Secondary | ICD-10-CM

## 2013-11-23 ENCOUNTER — Encounter: Payer: Self-pay | Admitting: Family Medicine

## 2013-11-23 NOTE — Progress Notes (Signed)
Subjective:    Patient ID: Samantha Glover, female    DOB: [birth year], 48 y.o.   MRN: [id number]  HPI 11/23/12 Patient is here today for followup of her hypertension and her hyperlipidemia. Currently she is taking Hyzaar 50/12.5 one by mouth daily for hypertension. Her blood pressure is well-controlled at 122/82. She is also taking Lipitor 20 mg by mouth daily for her cholesterol. Her most recent lab work is listed below. She denies chest pain, shortness of breath, dyspnea on exertion, right upper quadrant pain, or myalgias. Past Medical History  Diagnosis Date  . Diabetes mellitus without complication   . Hypercholesterolemia   . Hypertension   . Thyroid disorder     hypothyroidism  . ADD (attention deficit disorder)    Current Outpatient Prescriptions on File Prior to Visit  Medication Sig Dispense Refill  . atorvastatin (LIPITOR) 20 MG tablet TAKE 1 TABLET EVERY DAY  90 tablet  3  . cetirizine (ZYRTEC) 10 MG tablet Take 10 mg by mouth daily.      Marland Kitchen losartan (COZAAR) 50 MG tablet TAKE 1 TABLET EVERY DAY  30 tablet  5  . triamcinolone (NASACORT) 55 MCG/ACT nasal inhaler Place 2 sprays into the nose daily.      . vitamin B-12 (CYANOCOBALAMIN) 500 MCG tablet Take 500 mcg by mouth daily.       No current facility-administered medications on file prior to visit.   No Known Allergies History   Social History  . Marital Status: Married    Spouse Name: N/A    Number of Children: N/A  . Years of Education: N/A   Occupational History  . Not on file.   Social History Main Topics  . Smoking status: Former Research scientist (life sciences)  . Smokeless tobacco: Never Used  . Alcohol Use: No  . Drug Use: No  . Sexual Activity: Not on file     Comment: married.  2 sons.  Currently, stay at home mom.   Other Topics Concern  . Not on file   Social History Narrative  . No narrative on file    Labs continued to show elevated triglycerides at 238, low HDL at 31, and unfortunately a hemoglobin A1c of 6.6 with a  fasting blood sugar of 178.  At that time, my plan was: 1. Type II or unspecified type diabetes mellitus without mention of complication, not stated as uncontrolled This is a new diagnosis.  Spent approximately 20 minutes discussing diet, lifestyle, exercise, and implications of her lab work. She will begin a low carbohydrate diet and gradually increase aerobic exercise. I am going to set her up to see a nutritionist for diabetic education. We will recheck a fasting lipid panel and hemoglobin A1c in 3 months the  2. HTN (hypertension) Blood pressure is well controlled. However given her elevated triglycerides as well as her elevated blood sugar, I will discontinue the hydrochlorothiazide portion of Hyzaar. Begin losartan 50 mg by mouth daily  3. HLD (hyperlipidemia) Work on diet to address triglycerides. We did discuss the possible increase in her blood sugar due to the Lipitor.  I feel the effect is likely small. Recheck labs in 3 months. If sugars continue to increase we may want to discontinue Lipitor.  02/27/13 She is here today for follow up.  The patient did really well initially. She lost 10 pounds. Unfortunately she becomes lax in her diet and has gained 10 pounds back. She is not exercising. She thinks that she is having difficult time concentrating. 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 She finds that she is having to reread the same material while studying. She thinks this could be due to her cholesterol medicine. She denies any problems with focus or attention in high school. She is interested in discontinuing the cholesterol medicine. Her blood pressure today is borderline at 126/90. She is not been checking her blood sugar.  At that time, my plan was: 1. Type II or unspecified type diabetes mellitus without mention of complication, not stated as uncontrolled Spent 25 minutes and total patient discussing diet exercise and weight loss. I recommended 30 minutes of aerobic exercise  on a daily basis. I recommended a low carbohydrate diet. I recommended that the patient wait herself on a scale every day as a means to motivate her to perform these lifestyle changes and also as a means to "convict" her when her diet has become lax.  Actually believe that with 15 pounds of weight loss her diabetes we will controlled. - Hemoglobin A1c  2. HTN (hypertension) Pressure is borderline. I recommended 15 pounds of weight loss and I anticipate her diastolic blood pressure will improve - COMPLETE METABOLIC PANEL WITH GFR - Lipid panel  3. HLD (hyperlipidemia) Check fasting lipid panel. Goal LDL is less than 100. I am okay with her temporarily discontinuing Lipitor to see if her focus will improve. If he does not, I believe the patient may have mild ADD and I recommend resuming the Lipitor. - Lipid panel  11/23/13 Patient's here today for followup. She is able to pass a real estate course and she now has her real Forensic scientist. She is no longer taking medication for ADD. She has lost 7 pounds since her office visit in November. She is trying to diet and exercise better. She denies any chest pain shortness of breath or dyspnea on exertion. She denies any myalgias right upper quadrant pain on Lipitor. She denies any polyuria, polydipsia, blurred vision , neuropathy in her feet. Her most recent labwork as listed below. Appointment on 11/19/2013  Component Date Value Ref Range Status  . WBC 11/19/2013 7.8  4.0 - 10.5 K/uL Final  . RBC 11/19/2013 4.69  3.87 - 5.11 MIL/uL Final  . Hemoglobin 11/19/2013 14.2  12.0 - 15.0 g/dL Final  . HCT 11/19/2013 40.7  36.0 - 46.0 % Final  . MCV 11/19/2013 86.8  78.0 - 100.0 fL Final  . MCH 11/19/2013 30.3  26.0 - 34.0 pg Final  . MCHC 11/19/2013 34.9  30.0 - 36.0 g/dL Final  . RDW 11/19/2013 13.2  11.5 - 15.5 % Final  . Platelets 11/19/2013 261  150 - 400 K/uL Final  . Neutrophils Relative % 11/19/2013 51  43 - 77 % Final  . Neutro Abs 11/19/2013 4.0   1.7 - 7.7 K/uL Final  . Lymphocytes Relative 11/19/2013 30  12 - 46 % Final  . Lymphs Abs 11/19/2013 2.3  0.7 - 4.0 K/uL Final  . Monocytes Relative 11/19/2013 5  3 - 12 % Final  . Monocytes Absolute 11/19/2013 0.4  0.1 - 1.0 K/uL Final  . Eosinophils Relative 11/19/2013 13* 0 - 5 % Final  . Eosinophils Absolute 11/19/2013 1.0* 0.0 - 0.7 K/uL Final  . Basophils Relative 11/19/2013 1  0 - 1 % Final  . Basophils Absolute 11/19/2013 0.1  0.0 - 0.1 K/uL Final  . Smear Review 11/19/2013 Criteria for review not met   Final  . Sodium 11/19/2013 137  135 - 145 mEq/L  Final  . Potassium 11/19/2013 4.4  3.5 - 5.3 mEq/L Final  . Chloride 11/19/2013 102  96 - 112 mEq/L Final  . CO2 11/19/2013 27  19 - 32 mEq/L Final  . Glucose, Bld 11/19/2013 129* 70 - 99 mg/dL Final  . BUN 11/19/2013 10  6 - 23 mg/dL Final  . Creat 11/19/2013 0.77  0.50 - 1.10 mg/dL Final  . Total Bilirubin 11/19/2013 0.4  0.2 - 1.2 mg/dL Final  . Alkaline Phosphatase 11/19/2013 75  39 - 117 U/L Final  . AST 11/19/2013 21  0 - 37 U/L Final  . ALT 11/19/2013 41* 0 - 35 U/L Final  . Total Protein 11/19/2013 7.0  6.0 - 8.3 g/dL Final  . Albumin 11/19/2013 4.2  3.5 - 5.2 g/dL Final  . Calcium 11/19/2013 9.3  8.4 - 10.5 mg/dL Final  . Hemoglobin A1C 11/19/2013 6.5* <5.7 % Final   Comment:                                                                                                 According to the ADA Clinical Practice Recommendations for 2011, when                          HbA1c is used as a screening test:                                                       >=6.5%   Diagnostic of Diabetes Mellitus                                     (if abnormal result is confirmed)                                                     5.7-6.4%   Increased risk of developing Diabetes Mellitus                                                     References:Diagnosis and Classification of Diabetes Mellitus,Diabetes                           INOM,7672,09(OBSJG 1):S62-S69 and Standards of Medical Care in                                  Diabetes - 2011,Diabetes GEZM,6294,76 (Suppl 1):S11-S61.                             Marland Kitchen  Mean Plasma Glucose 11/19/2013 140* <117 mg/dL Final  . Cholesterol 11/19/2013 135  0 - 200 mg/dL Final   Comment: ATP III Classification:                                < 200        mg/dL        Desirable                               200 - 239     mg/dL        Borderline High                               >= 240        mg/dL        High                             . Triglycerides 11/19/2013 94  <150 mg/dL Final  . HDL 11/19/2013 34* >39 mg/dL Final  . Total CHOL/HDL Ratio 11/19/2013 4.0   Final  . VLDL 11/19/2013 19  0 - 40 mg/dL Final  . LDL Cholesterol 11/19/2013 82  0 - 99 mg/dL Final   Comment:                            Total Cholesterol/HDL Ratio:CHD Risk                                                 Coronary Heart Disease Risk Table                                                                 Men       Women                                   1/2 Average Risk              3.4        3.3                                       Average Risk              5.0        4.4                                    2X Average Risk              9.6        7.1  3X Average Risk             23.4       11.0                          Use the calculated Patient Ratio above and the CHD Risk table                           to determine the patient's CHD Risk.                          ATP III Classification (LDL):                                < 100        mg/dL         Optimal                               100 - 129     mg/dL         Near or Above Optimal                               130 - 159     mg/dL         Borderline High                               160 - 189     mg/dL         High                                > 190        mg/dL         Very High                                 Review of Systems  All other systems reviewed and are negative.      Objective:   Physical Exam  Vitals reviewed. Neck: Neck supple. No thyromegaly present.  Cardiovascular: Normal rate, regular rhythm, normal heart sounds and intact distal pulses.   No murmur heard. Pulmonary/Chest: Effort normal and breath sounds normal. No respiratory distress. She has no wheezes. She has no rales.  Abdominal: Soft. Bowel sounds are normal. She exhibits no distension and no mass. There is no tenderness. There is no rebound and no guarding.          Assessment & Plan:  1. Type II or unspecified type diabetes mellitus without mention of complication, not stated as uncontrolled Pressure is now well controlled. Patient is able to manage her diabetes with diet and exercise. I encouraged her to try to lose an additional 15 pounds. Continued on aggressive lifestyle changes including a low carbohydrate diet and daily aerobic exercise.  Followup in 6 months. At that time I will check a urine microalbumin.  2. Essential hypertension Patient's blood pressure is borderline but I believe of increased weight loss she'll be able to drop her diastolic pressure below 90. Patient  is in agreement to try lifestyle changes first.  3. HLD (hyperlipidemia) LDL cholesterol is now below 100. She was able to drop her LDL cholesterol by 20 points. Congratulated patient on her hard work. However her HDL remains low. I recommended vigorous daily aerobic exercise to try raise ratio greater than 40.

## 2013-12-13 ENCOUNTER — Other Ambulatory Visit: Payer: Self-pay | Admitting: Family Medicine

## 2014-01-10 ENCOUNTER — Ambulatory Visit: Payer: BC Managed Care – PPO | Admitting: *Deleted

## 2014-01-10 VITALS — BP 118/68

## 2014-01-10 DIAGNOSIS — I10 Essential (primary) hypertension: Secondary | ICD-10-CM

## 2014-01-29 ENCOUNTER — Ambulatory Visit (INDEPENDENT_AMBULATORY_CARE_PROVIDER_SITE_OTHER): Payer: BC Managed Care – PPO | Admitting: Family Medicine

## 2014-01-29 DIAGNOSIS — E119 Type 2 diabetes mellitus without complications: Secondary | ICD-10-CM

## 2014-01-29 DIAGNOSIS — Z79899 Other long term (current) drug therapy: Secondary | ICD-10-CM

## 2014-01-29 DIAGNOSIS — I10 Essential (primary) hypertension: Secondary | ICD-10-CM

## 2014-01-29 DIAGNOSIS — E785 Hyperlipidemia, unspecified: Secondary | ICD-10-CM

## 2014-01-29 DIAGNOSIS — Z23 Encounter for immunization: Secondary | ICD-10-CM

## 2014-01-29 DIAGNOSIS — E039 Hypothyroidism, unspecified: Secondary | ICD-10-CM

## 2014-02-19 ENCOUNTER — Other Ambulatory Visit: Payer: BC Managed Care – PPO

## 2014-02-19 DIAGNOSIS — E119 Type 2 diabetes mellitus without complications: Secondary | ICD-10-CM

## 2014-02-19 DIAGNOSIS — Z79899 Other long term (current) drug therapy: Secondary | ICD-10-CM

## 2014-02-19 DIAGNOSIS — E785 Hyperlipidemia, unspecified: Secondary | ICD-10-CM

## 2014-02-19 DIAGNOSIS — I10 Essential (primary) hypertension: Secondary | ICD-10-CM

## 2014-02-19 DIAGNOSIS — E039 Hypothyroidism, unspecified: Secondary | ICD-10-CM

## 2014-02-19 LAB — COMPLETE METABOLIC PANEL WITH GFR
ALBUMIN: 4.3 g/dL (ref 3.5–5.2)
ALT: 44 U/L — AB (ref 0–35)
AST: 22 U/L (ref 0–37)
Alkaline Phosphatase: 78 U/L (ref 39–117)
BUN: 10 mg/dL (ref 6–23)
CO2: 26 mEq/L (ref 19–32)
Calcium: 9.4 mg/dL (ref 8.4–10.5)
Chloride: 101 mEq/L (ref 96–112)
Creat: 0.81 mg/dL (ref 0.50–1.10)
GFR, Est African American: >89 mL/min
GFR, Est Non African American: 86 mL/min
Glucose, Bld: 123 mg/dL — ABNORMAL HIGH (ref 70–99)
POTASSIUM: 4.6 meq/L (ref 3.5–5.3)
Sodium: 137 mEq/L (ref 135–145)
TOTAL PROTEIN: 7.1 g/dL (ref 6.0–8.3)
Total Bilirubin: 0.5 mg/dL (ref 0.2–1.2)

## 2014-02-19 LAB — CBC WITH DIFFERENTIAL/PLATELET
Basophils Absolute: 0.1 10*3/uL (ref 0.0–0.1)
Basophils Relative: 1 % (ref 0–1)
Eosinophils Absolute: 0.6 10*3/uL (ref 0.0–0.7)
Eosinophils Relative: 6 % — ABNORMAL HIGH (ref 0–5)
HCT: 42.5 % (ref 36.0–46.0)
HEMOGLOBIN: 14.5 g/dL (ref 12.0–15.0)
LYMPHS ABS: 2.8 10*3/uL (ref 0.7–4.0)
Lymphocytes Relative: 30 % (ref 12–46)
MCH: 29.9 pg (ref 26.0–34.0)
MCHC: 34.1 g/dL (ref 30.0–36.0)
MCV: 87.6 fL (ref 78.0–100.0)
MONOS PCT: 6 % (ref 3–12)
Monocytes Absolute: 0.6 10*3/uL (ref 0.1–1.0)
NEUTROS ABS: 5.2 10*3/uL (ref 1.7–7.7)
NEUTROS PCT: 57 % (ref 43–77)
PLATELETS: 251 10*3/uL (ref 150–400)
RBC: 4.85 MIL/uL (ref 3.87–5.11)
RDW: 12.7 % (ref 11.5–15.5)
WBC: 9.2 10*3/uL (ref 4.0–10.5)

## 2014-02-19 LAB — LIPID PANEL
Cholesterol: 131 mg/dL (ref 0–200)
HDL: 33 mg/dL — AB (ref >39)
LDL Cholesterol: 71 mg/dL (ref 0–99)
Total CHOL/HDL Ratio: 4 Ratio
Triglycerides: 135 mg/dL (ref <150)
VLDL: 27 mg/dL (ref 0–40)

## 2014-02-19 LAB — TSH: TSH: 5.274 u[IU]/mL — AB (ref 0.350–4.500)

## 2014-02-19 LAB — HEMOGLOBIN A1C
Hgb A1c MFr Bld: 6.7 % — ABNORMAL HIGH (ref <5.7)
MEAN PLASMA GLUCOSE: 146 mg/dL — AB (ref <117)

## 2014-02-21 ENCOUNTER — Ambulatory Visit: Payer: BC Managed Care – PPO | Admitting: Family Medicine

## 2014-02-22 ENCOUNTER — Ambulatory Visit (INDEPENDENT_AMBULATORY_CARE_PROVIDER_SITE_OTHER): Payer: BC Managed Care – PPO | Admitting: Family Medicine

## 2014-02-22 ENCOUNTER — Encounter: Payer: Self-pay | Admitting: Family Medicine

## 2014-02-22 VITALS — BP 110/74 | HR 76 | Temp 98.4°F | Resp 18 | Ht 63.0 in | Wt 215.0 lb

## 2014-02-22 DIAGNOSIS — I10 Essential (primary) hypertension: Secondary | ICD-10-CM

## 2014-02-22 DIAGNOSIS — E119 Type 2 diabetes mellitus without complications: Secondary | ICD-10-CM

## 2014-02-22 DIAGNOSIS — E785 Hyperlipidemia, unspecified: Secondary | ICD-10-CM

## 2014-02-22 NOTE — Progress Notes (Signed)
Subjective:    Patient ID: Samantha Glover, female    DOB: 1966-01-19, 48 y.o.   MRN: [id number]  HPI Patient is here today for follow-up of her hypertension, hyperlipidemia, and diabetes.  Overall she is doing well. She does report increasing fatigue and dizziness at times. Her blood pressure is low today at 110/74. Her most recent lab work as listed below. Her labs are significant for hemoglobin A1c of 6.7. An HDL cholesterol of 33, and a slightly elevated TSH. Appointment on 02/19/2014  Component Date Value Ref Range Status  . WBC 02/19/2014 9.2  4.0 - 10.5 K/uL Final  . RBC 02/19/2014 4.85  3.87 - 5.11 MIL/uL Final  . Hemoglobin 02/19/2014 14.5  12.0 - 15.0 g/dL Final  . HCT 02/19/2014 42.5  36.0 - 46.0 % Final  . MCV 02/19/2014 87.6  78.0 - 100.0 fL Final  . MCH 02/19/2014 29.9  26.0 - 34.0 pg Final  . MCHC 02/19/2014 34.1  30.0 - 36.0 g/dL Final  . RDW 02/19/2014 12.7  11.5 - 15.5 % Final  . Platelets 02/19/2014 251  150 - 400 K/uL Final  . Neutrophils Relative % 02/19/2014 57  43 - 77 % Final  . Neutro Abs 02/19/2014 5.2  1.7 - 7.7 K/uL Final  . Lymphocytes Relative 02/19/2014 30  12 - 46 % Final  . Lymphs Abs 02/19/2014 2.8  0.7 - 4.0 K/uL Final  . Monocytes Relative 02/19/2014 6  3 - 12 % Final  . Monocytes Absolute 02/19/2014 0.6  0.1 - 1.0 K/uL Final  . Eosinophils Relative 02/19/2014 6* 0 - 5 % Final  . Eosinophils Absolute 02/19/2014 0.6  0.0 - 0.7 K/uL Final  . Basophils Relative 02/19/2014 1  0 - 1 % Final  . Basophils Absolute 02/19/2014 0.1  0.0 - 0.1 K/uL Final  . Smear Review 02/19/2014 Criteria for review not met   Final  . Hemoglobin A1C 02/19/2014 6.7* <5.7 % Final   Comment:                                                                                                 According to the ADA Clinical Practice Recommendations for 2011, when                          HbA1c is used as a screening test:                                                       >=6.5%    Diagnostic of Diabetes Mellitus                                     (if abnormal result is confirmed)  5.7-6.4%   Increased risk of developing Diabetes Mellitus                                                     References:Diagnosis and Classification of Diabetes Mellitus,Diabetes                          LKGM,0102,72(ZDGUY 1):S62-S69 and Standards of Medical Care in                                  Diabetes - 2011,Diabetes QIHK,7425,95 (Suppl 1):S11-S61.                             . Mean Plasma Glucose 02/19/2014 146* <117 mg/dL Final  . Cholesterol 02/19/2014 131  0 - 200 mg/dL Final   Comment: ATP III Classification:                                < 200        mg/dL        Desirable                               200 - 239     mg/dL        Borderline High                               >= 240        mg/dL        High                             . Triglycerides 02/19/2014 135  <150 mg/dL Final  . HDL 02/19/2014 33* >39 mg/dL Final  . Total CHOL/HDL Ratio 02/19/2014 4.0   Final  . VLDL 02/19/2014 27  0 - 40 mg/dL Final  . LDL Cholesterol 02/19/2014 71  0 - 99 mg/dL Final   Comment:                            Total Cholesterol/HDL Ratio:CHD Risk                                                 Coronary Heart Disease Risk Table                                                                 Men       Women  1/2 Average Risk              3.4        3.3                                       Average Risk              5.0        4.4                                    2X Average Risk              9.6        7.1                                    3X Average Risk             23.4       11.0                          Use the calculated Patient Ratio above and the CHD Risk table                           to determine the patient's CHD Risk.                          ATP III Classification (LDL):                                 < 100        mg/dL         Optimal                               100 - 129     mg/dL         Near or Above Optimal                               130 - 159     mg/dL         Borderline High                               160 - 189     mg/dL         High                                > 190        mg/dL         Very High                             . TSH 02/19/2014 5.274* 0.350 - 4.500 uIU/mL Final  . Sodium 02/19/2014 137  135 - 145 mEq/L Final  . Potassium  02/19/2014 4.6  3.5 - 5.3 mEq/L Final  . Chloride 02/19/2014 101  96 - 112 mEq/L Final  . CO2 02/19/2014 26  19 - 32 mEq/L Final  . Glucose, Bld 02/19/2014 123* 70 - 99 mg/dL Final  . BUN 02/19/2014 10  6 - 23 mg/dL Final  . Creat 02/19/2014 0.81  0.50 - 1.10 mg/dL Final  . Total Bilirubin 02/19/2014 0.5  0.2 - 1.2 mg/dL Final  . Alkaline Phosphatase 02/19/2014 78  39 - 117 U/L Final  . AST 02/19/2014 22  0 - 37 U/L Final  . ALT 02/19/2014 44* 0 - 35 U/L Final  . Total Protein 02/19/2014 7.1  6.0 - 8.3 g/dL Final  . Albumin 02/19/2014 4.3  3.5 - 5.2 g/dL Final  . Calcium 02/19/2014 9.4  8.4 - 10.5 mg/dL Final  . GFR, Est African American 02/19/2014 >89   Final  . GFR, Est Non African American 02/19/2014 86   Final   Comment:                            The estimated GFR is a calculation valid for adults (>=24 years old)                          that uses the CKD-EPI algorithm to adjust for age and sex. It is                            not to be used for children, pregnant women, hospitalized patients,                             patients on dialysis, or with rapidly changing kidney function.                          According to the NKDEP, eGFR >89 is normal, 60-89 shows mild                          impairment, 30-59 shows moderate impairment, 15-29 shows severe                          impairment and <15 is ESRD.                              Past Medical History  Diagnosis Date  . Diabetes mellitus without complication    . Hypercholesterolemia   . Hypertension   . Thyroid disorder     hypothyroidism  . ADD (attention deficit disorder)    Past Surgical History  Procedure Laterality Date  . Cervix surgery    . Breast surgery    . Eye surgery     Current Outpatient Prescriptions on File Prior to Visit  Medication Sig Dispense Refill  . atorvastatin (LIPITOR) 20 MG tablet TAKE 1 TABLET EVERY DAY  90 tablet  3  . cetirizine (ZYRTEC) 10 MG tablet Take 10 mg by mouth daily.      . Diethylpropion HCl 25 MG TABS Take 25 mg by mouth.      . losartan (COZAAR) 50 MG tablet TAKE 1 TABLET EVERY DAY  30 tablet  11  . triamcinolone (NASACORT) 55 MCG/ACT  nasal inhaler Place 2 sprays into the nose daily.      . vitamin B-12 (CYANOCOBALAMIN) 500 MCG tablet Take 500 mcg by mouth daily.       No current facility-administered medications on file prior to visit.   No Known Allergies History   Social History  . Marital Status: Married    Spouse Name: N/A    Number of Children: N/A  . Years of Education: N/A   Occupational History  . Not on file.   Social History Main Topics  . Smoking status: Former Research scientist (life sciences)  . Smokeless tobacco: Never Used  . Alcohol Use: No  . Drug Use: No  . Sexual Activity: Not on file     Comment: married.  2 sons.  Currently, stay at home mom.   Other Topics Concern  . Not on file   Social History Narrative  . No narrative on file   Family History  Problem Relation Age of Onset  . Hyperlipidemia Mother   . Hypertension Mother   . Hyperlipidemia Sister   . Hypertension Sister   . Hyperlipidemia Sister   . Hypertension Sister       Review of Systems  All other systems reviewed and are negative.      Objective:   Physical Exam  Vitals reviewed. Constitutional: She appears well-developed and well-nourished.  Neck: Neck supple. No thyromegaly present.  Cardiovascular: Normal rate, regular rhythm and normal heart sounds.   Pulmonary/Chest: Effort normal and breath  sounds normal. No respiratory distress. She has no wheezes. She has no rales. She exhibits no tenderness.  Abdominal: Soft. Bowel sounds are normal.  Musculoskeletal: She exhibits no edema.          Assessment & Plan:  Type 2 diabetes mellitus without complication  HLD (hyperlipidemia)  Essential hypertension  Patient's blood pressure continues to drop as she loses weight. Therefore I want her to decrease her losartan to 25 mg by mouth daily. Diabetes test is acceptable. HDL is extremely low. I have recommended 15 pounds of weight loss. I believe that the patient can get below 200 pounds, her HDL cholesterol and her diabetes test will normalize. I encouraged vigorous aerobic exercise 5 days a week 30 minutes a day. I would like to recheck her TSH in 3 months. If her TSH continues to worsen, and the patient continues to report fatigue, we can start the patient on low-dose thyroid replacement.

## 2014-05-27 ENCOUNTER — Ambulatory Visit: Payer: BC Managed Care – PPO | Admitting: Family Medicine

## 2014-06-11 ENCOUNTER — Ambulatory Visit (INDEPENDENT_AMBULATORY_CARE_PROVIDER_SITE_OTHER): Payer: BLUE CROSS/BLUE SHIELD | Admitting: Family Medicine

## 2014-06-11 ENCOUNTER — Encounter: Payer: Self-pay | Admitting: Family Medicine

## 2014-06-11 VITALS — BP 120/84 | HR 78 | Temp 98.2°F | Resp 18 | Ht 63.0 in | Wt 218.0 lb

## 2014-06-11 DIAGNOSIS — E039 Hypothyroidism, unspecified: Secondary | ICD-10-CM

## 2014-06-11 DIAGNOSIS — I10 Essential (primary) hypertension: Secondary | ICD-10-CM

## 2014-06-11 DIAGNOSIS — E785 Hyperlipidemia, unspecified: Secondary | ICD-10-CM

## 2014-06-11 DIAGNOSIS — E119 Type 2 diabetes mellitus without complications: Secondary | ICD-10-CM

## 2014-06-11 DIAGNOSIS — E038 Other specified hypothyroidism: Secondary | ICD-10-CM

## 2014-06-11 LAB — COMPLETE METABOLIC PANEL WITH GFR
ALBUMIN: 4 g/dL (ref 3.5–5.2)
ALT: 30 U/L (ref 0–35)
AST: 20 U/L (ref 0–37)
Alkaline Phosphatase: 82 U/L (ref 39–117)
BUN: 12 mg/dL (ref 6–23)
CHLORIDE: 100 meq/L (ref 96–112)
CO2: 26 mEq/L (ref 19–32)
Calcium: 9.2 mg/dL (ref 8.4–10.5)
Creat: 0.7 mg/dL (ref 0.50–1.10)
GFR, Est African American: >89 mL/min
GLUCOSE: 135 mg/dL — AB (ref 70–99)
POTASSIUM: 4 meq/L (ref 3.5–5.3)
Sodium: 136 mEq/L (ref 135–145)
TOTAL PROTEIN: 6.8 g/dL (ref 6.0–8.3)
Total Bilirubin: 0.5 mg/dL (ref 0.2–1.2)

## 2014-06-11 LAB — LIPID PANEL
CHOLESTEROL: 177 mg/dL (ref 0–200)
HDL: 34 mg/dL — ABNORMAL LOW (ref >39)
LDL Cholesterol: 91 mg/dL (ref 0–99)
Total CHOL/HDL Ratio: 5.2 Ratio
Triglycerides: 260 mg/dL — ABNORMAL HIGH (ref <150)
VLDL: 52 mg/dL — ABNORMAL HIGH (ref 0–40)

## 2014-06-11 LAB — HEMOGLOBIN A1C
HEMOGLOBIN A1C: 6.7 % — AB (ref <5.7)
MEAN PLASMA GLUCOSE: 146 mg/dL — AB (ref <117)

## 2014-06-11 LAB — TSH: TSH: 5.332 u[IU]/mL — AB (ref 0.350–4.500)

## 2014-06-11 LAB — MICROALBUMIN, URINE: Microalb, Ur: 0.8 mg/dL (ref <2.0)

## 2014-06-11 NOTE — Progress Notes (Signed)
Subjective:    Patient ID: Samantha Glover, female    DOB: Jul 29, 1965, 49 y.o.   MRN: [id number]  HPI Patient is a very pleasant 49 year old white female who presents today to follow-up her hyperlipidemia, hypertension, and diabetes mellitus type 2. Since I last saw the patient she has gained a proximally 2 pounds. She admits that she is not exercising regularly. She is also not watching her diet as well as she should. She also reports severe fatigue. She reports excessive daytime sleepiness. She also reports trouble staying awake sometimes while she is driving. She does snore although her husband denies any symptoms or episodes of sleep apnea. She denies any chest pain shortness of breath or dyspnea on exertion. She denies any myalgias or right upper quadrant pain. She denies any polyuria, polydipsia, or blurred vision. Past Medical History  Diagnosis Date  . Diabetes mellitus without complication   . Hypercholesterolemia   . Hypertension   . Thyroid disorder     hypothyroidism  . ADD (attention deficit disorder)    Past Surgical History  Procedure Laterality Date  . Cervix surgery    . Breast surgery    . Eye surgery     Current Outpatient Prescriptions on File Prior to Visit  Medication Sig Dispense Refill  . atorvastatin (LIPITOR) 20 MG tablet TAKE 1 TABLET EVERY DAY 90 tablet 3  . cetirizine (ZYRTEC) 10 MG tablet Take 10 mg by mouth daily.    . Diethylpropion HCl 25 MG TABS Take 25 mg by mouth.    . losartan (COZAAR) 50 MG tablet TAKE 1 TABLET EVERY DAY 30 tablet 11  . triamcinolone (NASACORT) 55 MCG/ACT nasal inhaler Place 2 sprays into the nose daily.    . vitamin B-12 (CYANOCOBALAMIN) 500 MCG tablet Take 500 mcg by mouth daily.     No current facility-administered medications on file prior to visit.   No Known Allergies History   Social History  . Marital Status: Married    Spouse Name: N/A  . Number of Children: N/A  . Years of Education: N/A   Occupational History    . Not on file.   Social History Main Topics  . Smoking status: Former Research scientist (life sciences)  . Smokeless tobacco: Never Used  . Alcohol Use: No  . Drug Use: No  . Sexual Activity: Not on file     Comment: married.  2 sons.  Currently, stay at home mom.   Other Topics Concern  . Not on file   Social History Narrative      Review of Systems  All other systems reviewed and are negative.      Objective:   Physical Exam  Constitutional: She appears well-developed and well-nourished. No distress.  Neck: Neck supple. No JVD present. No thyromegaly present.  Cardiovascular: Normal rate, regular rhythm and normal heart sounds.   No murmur heard. Pulmonary/Chest: Effort normal and breath sounds normal. No respiratory distress. She has no wheezes. She has no rales.  Abdominal: Soft. Bowel sounds are normal. She exhibits no distension. There is no tenderness. There is no rebound and no guarding.  Musculoskeletal: She exhibits no edema.  Lymphadenopathy:    She has no cervical adenopathy.  Skin: She is not diaphoretic.  Vitals reviewed.         Assessment & Plan:  Diabetes mellitus type II, controlled - Plan: COMPLETE METABOLIC PANEL WITH GFR, CBC with Differential/Platelet, Lipid panel, Hemoglobin A1c, Microalbumin, urine  Subclinical hypothyroidism - Plan: TSH  HLD (hyperlipidemia)  Essential hypertension  Patient's blood pressures well controlled. I will make no changes in her blood pressure medication at the present time. Regarding her hyperlipidemia, I will check a fasting lipid panel. Goal LDL cholesterol is less than 100. Her diabetic foot exam is performed today and is normal. We had a long discussion about a low carbohydrate diet. I also recommended diet exercise and weight loss. I will check a hemoglobin A1c as well as a urine microalbumin. Goal hemoglobin A1c is less than 6.5. I will also monitor the patient's TSH given her fatigue. She has subclinical hypothyroidism. Patient's  symptoms sound concerning for sleep apnea. I recommended a sleep study but the patient wants to defer that at the present time and try aggressive diet exercise and weight loss first. If symptoms worsen she will then consent to a sleep study.

## 2014-06-12 LAB — CBC WITH DIFFERENTIAL/PLATELET
BASOS ABS: 0.1 10*3/uL (ref 0.0–0.1)
BASOS PCT: 1 % (ref 0–1)
EOS ABS: 0.8 10*3/uL — AB (ref 0.0–0.7)
Eosinophils Relative: 9 % — ABNORMAL HIGH (ref 0–5)
HCT: 43.1 % (ref 36.0–46.0)
HEMOGLOBIN: 14.8 g/dL (ref 12.0–15.0)
Lymphocytes Relative: 27 % (ref 12–46)
Lymphs Abs: 2.4 10*3/uL (ref 0.7–4.0)
MCH: 30.1 pg (ref 26.0–34.0)
MCHC: 34.3 g/dL (ref 30.0–36.0)
MCV: 87.8 fL (ref 78.0–100.0)
MPV: 10.2 fL (ref 8.6–12.4)
Monocytes Absolute: 0.6 10*3/uL (ref 0.1–1.0)
Monocytes Relative: 7 % (ref 3–12)
NEUTROS PCT: 56 % (ref 43–77)
Neutro Abs: 5 10*3/uL (ref 1.7–7.7)
Platelets: 271 10*3/uL (ref 150–400)
RBC: 4.91 MIL/uL (ref 3.87–5.11)
RDW: 12.8 % (ref 11.5–15.5)
WBC: 8.9 10*3/uL (ref 4.0–10.5)

## 2014-08-03 ENCOUNTER — Telehealth (HOSPITAL_COMMUNITY): Payer: Self-pay | Admitting: Family Medicine

## 2014-08-03 ENCOUNTER — Emergency Department (HOSPITAL_COMMUNITY)
Admission: EM | Admit: 2014-08-03 | Discharge: 2014-08-03 | Disposition: A | Discharge status: Home / Self Care | Payer: BLUE CROSS/BLUE SHIELD | Source: Home / Self Care | Attending: Family Medicine | Admitting: Family Medicine

## 2014-08-03 ENCOUNTER — Encounter (HOSPITAL_COMMUNITY): Payer: Self-pay | Admitting: Emergency Medicine

## 2014-08-03 DIAGNOSIS — H578 Other specified disorders of eye and adnexa: Secondary | ICD-10-CM | POA: Diagnosis not present

## 2014-08-03 DIAGNOSIS — H5789 Other specified disorders of eye and adnexa: Secondary | ICD-10-CM

## 2014-08-03 MED ORDER — TETRACAINE HCL 0.5 % OP SOLN
OPHTHALMIC | Status: AC
  Filled 2014-08-03: qty 2

## 2014-08-03 MED ORDER — DICLOFENAC SODIUM 0.1 % OP SOLN: 1.0000 [drp] | Freq: Four times a day (QID) | OPHTHALMIC | Status: DC

## 2014-08-03 MED ORDER — POLYMYXIN B-TRIMETHOPRIM 10000-0.1 UNIT/ML-% OP SOLN: 1.0000 [drp] | OPHTHALMIC | Status: DC

## 2014-08-03 NOTE — ED Notes (Signed)
Reports she inj her right eye on Tuesday States she was having a manicure when she scratched her right eye Thinks glue got inside of eye Sx include pain, redness and blurry vision Alert, no signs of acute distress.

## 2014-08-03 NOTE — ED Notes (Signed)
Pt called back.  Pharmacy closed.  Rx resent to [facility]  Gregor Hams, MD 08/03/14 [phone]

## 2014-08-03 NOTE — ED Provider Notes (Signed)
Samantha Glover is a 49 y.o. female who presents to Urgent Care today for right eye pain. Patient was getting her nails done 5 days ago when she went to rub her eye and a small portion of the clear coat that was currently drying on her fingernail fell into her eye. The beautician removed the portion of clear code using some tweezers. Since then she's had eye irritation and drainage and matting of her eyes in the morning. She notes slight decrease in visual acuity. She denies any fevers or chills nausea vomiting or diarrhea. She has not tried any treatment yet. She feels well otherwise.  She has a history of Lasix surgery performed by Dr. Gershon Crane  Past Medical History  Diagnosis Date  . Diabetes mellitus without complication   . Hypercholesterolemia   . Hypertension   . Thyroid disorder     hypothyroidism  . ADD (attention deficit disorder)    Past Surgical History  Procedure Laterality Date  . Cervix surgery    . Breast surgery    . Eye surgery     History  Substance Use Topics  . Smoking status: Former Research scientist (life sciences)  . Smokeless tobacco: Never Used  . Alcohol Use: No   ROS as above Medications: No current facility-administered medications for this encounter.   Current Outpatient Prescriptions  Medication Sig Dispense Refill  . losartan (COZAAR) 50 MG tablet TAKE 1 TABLET EVERY DAY 30 tablet 11  . atorvastatin (LIPITOR) 20 MG tablet TAKE 1 TABLET EVERY DAY 90 tablet 3  . cetirizine (ZYRTEC) 10 MG tablet Take 10 mg by mouth daily.    . Diethylpropion HCl 25 MG TABS Take 25 mg by mouth.    . triamcinolone (NASACORT) 55 MCG/ACT nasal inhaler Place 2 sprays into the nose daily.    . vitamin B-12 (CYANOCOBALAMIN) 500 MCG tablet Take 500 mcg by mouth daily.     No Known Allergies   Exam:  BP 135/85 mmHg  Pulse 89  Temp(Src) 98.1 F (36.7 C) (Oral)  Resp 16  SpO2 96% Gen: Well NAD HEENT: EOMI,  MMM right eye conjunctiva injection mildly. PERRLA bilaterally. Normal fluorescein stain  without obvious corneal abrasion. No foreign body visible on detailed exam included an inversion of the upper eyelid. PH is 7 Lungs: Normal work of breathing. CTABL Heart: RRR no MRG Abd: NABS, Soft. Nondistended, Nontender Exts: Brisk capillary refill, warm and well perfused.   Visual Acuity:  Right Eye Distance: 20/30 Left Eye Distance: 20/20 Bilateral Distance: 20/20    No results found for this or any previous visit (from the past 24 hour(s)). No results found.  Assessment and Plan: 49 y.o. female with eye pain. I suspect this may be bacterial conjunctivitis. She had a foreign body in her eye that was removed using probably not very clean tweezers and a beautician's office. We'll start Polytrim and diclofenac eyedrops in encourage patient to follow-up with ophthalmology in the near future.  Discussed warning signs or symptoms. Please see discharge instructions. Patient expresses understanding.     Gregor Hams, MD 08/03/14 [phone]

## 2014-08-03 NOTE — Discharge Instructions (Signed)
Thank you for coming in today. Follow up with the eye doctor.   Bacterial Conjunctivitis Bacterial conjunctivitis, commonly called pink eye, is an inflammation of the clear membrane that covers the white part of the eye (conjunctiva). The inflammation can also happen on the underside of the eyelids. The blood vessels in the conjunctiva become inflamed, causing the eye to become red or pink. Bacterial conjunctivitis may spread easily from one eye to another and from person to person (contagious).  CAUSES  Bacterial conjunctivitis is caused by bacteria. The bacteria may come from your own skin, your upper respiratory tract, or from someone else with bacterial conjunctivitis. SYMPTOMS  The normally white color of the eye or the underside of the eyelid is usually pink or red. The pink eye is usually associated with irritation, tearing, and some sensitivity to light. Bacterial conjunctivitis is often associated with a thick, yellowish discharge from the eye. The discharge may turn into a crust on the eyelids overnight, which causes your eyelids to stick together. If a discharge is present, there may also be some blurred vision in the affected eye. DIAGNOSIS  Bacterial conjunctivitis is diagnosed by your caregiver through an eye exam and the symptoms that you report. Your caregiver looks for changes in the surface tissues of your eyes, which may point to the specific type of conjunctivitis. A sample of any discharge may be collected on a cotton-tip swab if you have a severe case of conjunctivitis, if your cornea is affected, or if you keep getting repeat infections that do not respond to treatment. The sample will be sent to a lab to see if the inflammation is caused by a bacterial infection and to see if the infection will respond to antibiotic medicines. TREATMENT   Bacterial conjunctivitis is treated with antibiotics. Antibiotic eyedrops are most often used. However, antibiotic ointments are also  available. Antibiotics pills are sometimes used. Artificial tears or eye washes may ease discomfort. HOME CARE INSTRUCTIONS   To ease discomfort, apply a cool, clean washcloth to your eye for 10-20 minutes, 3-4 times a day.  Gently wipe away any drainage from your eye with a warm, wet washcloth or a cotton ball.  Wash your hands often with soap and water. Use paper towels to dry your hands.  Do not share towels or washcloths. This may spread the infection.  Change or wash your pillowcase every day.  You should not use eye makeup until the infection is gone.  Do not operate machinery or drive if your vision is blurred.  Stop using contact lenses. Ask your caregiver how to sterilize or replace your contacts before using them again. This depends on the type of contact lenses that you use.  When applying medicine to the infected eye, do not touch the edge of your eyelid with the eyedrop bottle or ointment tube. SEEK IMMEDIATE MEDICAL CARE IF:   Your infection has not improved within 3 days after beginning treatment.  You had yellow discharge from your eye and it returns.  You have increased eye pain.  Your eye redness is spreading.  Your vision becomes blurred.  You have a fever or persistent symptoms for more than 2-3 days.  You have a fever and your symptoms suddenly get worse.  You have facial pain, redness, or swelling. MAKE SURE YOU:   Understand these instructions.  Will watch your condition.  Will get help right away if you are not doing well or get worse. Document Released: 04/12/2005 Document Revised: 08/27/2013 Document  Reviewed: 09/13/2011 Exit[employer] Patient Information 2015 [place], [place]. This information is not intended to replace advice given to you by your health care provider. Make sure you discuss any questions you have with your health care provider.

## 2014-10-20 ENCOUNTER — Other Ambulatory Visit: Payer: Self-pay | Admitting: Family Medicine

## 2014-12-03 ENCOUNTER — Other Ambulatory Visit: Payer: Self-pay | Admitting: Family Medicine

## 2014-12-03 ENCOUNTER — Encounter: Payer: Self-pay | Admitting: Family Medicine

## 2014-12-03 MED ORDER — LOSARTAN POTASSIUM 50 MG PO TABS: 50.0000 mg | ORAL_TABLET | Freq: Every day | ORAL | Status: DC

## 2014-12-03 MED ORDER — ATORVASTATIN CALCIUM 20 MG PO TABS: 20.0000 mg | ORAL_TABLET | Freq: Every day | ORAL | Status: DC

## 2014-12-03 NOTE — Telephone Encounter (Signed)
Medication refill for one time only.  Patient needs to be seen.  Letter sent for patient to call and schedule 

## 2015-03-06 ENCOUNTER — Telehealth: Payer: Self-pay | Admitting: Family Medicine

## 2015-03-06 ENCOUNTER — Other Ambulatory Visit: Payer: Self-pay | Admitting: Family Medicine

## 2015-03-06 DIAGNOSIS — Z79899 Other long term (current) drug therapy: Secondary | ICD-10-CM

## 2015-03-06 DIAGNOSIS — I10 Essential (primary) hypertension: Secondary | ICD-10-CM

## 2015-03-06 DIAGNOSIS — E119 Type 2 diabetes mellitus without complications: Secondary | ICD-10-CM

## 2015-03-06 DIAGNOSIS — E785 Hyperlipidemia, unspecified: Secondary | ICD-10-CM

## 2015-03-06 DIAGNOSIS — E079 Disorder of thyroid, unspecified: Secondary | ICD-10-CM

## 2015-03-06 MED ORDER — ATORVASTATIN CALCIUM 20 MG PO TABS: 20.0000 mg | ORAL_TABLET | Freq: Every day | ORAL | Status: DC

## 2015-03-06 MED ORDER — LOSARTAN POTASSIUM 50 MG PO TABS: 50.0000 mg | ORAL_TABLET | Freq: Every day | ORAL | Status: DC

## 2015-03-06 NOTE — Telephone Encounter (Signed)
Medication refilled per protocol. 

## 2015-03-14 ENCOUNTER — Other Ambulatory Visit: Payer: BLUE CROSS/BLUE SHIELD

## 2015-03-14 DIAGNOSIS — E785 Hyperlipidemia, unspecified: Secondary | ICD-10-CM

## 2015-03-14 DIAGNOSIS — E079 Disorder of thyroid, unspecified: Secondary | ICD-10-CM

## 2015-03-14 DIAGNOSIS — E119 Type 2 diabetes mellitus without complications: Secondary | ICD-10-CM

## 2015-03-14 DIAGNOSIS — I10 Essential (primary) hypertension: Secondary | ICD-10-CM

## 2015-03-14 DIAGNOSIS — Z79899 Other long term (current) drug therapy: Secondary | ICD-10-CM

## 2015-03-14 LAB — LIPID PANEL
Cholesterol: 160 mg/dL (ref 125–200)
HDL: 31 mg/dL — ABNORMAL LOW (ref >=46)
LDL CALC: 85 mg/dL (ref <130)
TRIGLYCERIDES: 220 mg/dL — AB (ref <150)
Total CHOL/HDL Ratio: 5.2 Ratio — ABNORMAL HIGH (ref <=5.0)
VLDL: 44 mg/dL — ABNORMAL HIGH (ref <30)

## 2015-03-14 LAB — COMPLETE METABOLIC PANEL WITH GFR
ALBUMIN: 3.9 g/dL (ref 3.6–5.1)
ALK PHOS: 82 U/L (ref 33–115)
ALT: 70 U/L — ABNORMAL HIGH (ref 6–29)
AST: 50 U/L — ABNORMAL HIGH (ref 10–35)
BILIRUBIN TOTAL: 0.6 mg/dL (ref 0.2–1.2)
BUN: 9 mg/dL (ref 7–25)
CALCIUM: 9 mg/dL (ref 8.6–10.2)
CO2: 22 mmol/L (ref 20–31)
Chloride: 102 mmol/L (ref 98–110)
Creat: 0.6 mg/dL (ref 0.50–1.10)
GFR, Est African American: >89 mL/min (ref >=60)
GLUCOSE: 183 mg/dL — AB (ref 70–99)
Potassium: 4 mmol/L (ref 3.5–5.3)
SODIUM: 136 mmol/L (ref 135–146)
TOTAL PROTEIN: 6.6 g/dL (ref 6.1–8.1)

## 2015-03-14 LAB — TSH: TSH: 6.357 u[IU]/mL — AB (ref 0.350–4.500)

## 2015-03-14 LAB — HEMOGLOBIN A1C
Hgb A1c MFr Bld: 8.5 % — ABNORMAL HIGH (ref <5.7)
Mean Plasma Glucose: 197 mg/dL — ABNORMAL HIGH (ref <117)

## 2015-03-15 LAB — CBC WITH DIFFERENTIAL/PLATELET
BASOS PCT: 1 % (ref 0–1)
Basophils Absolute: 0.1 10*3/uL (ref 0.0–0.1)
Eosinophils Absolute: 0.4 10*3/uL (ref 0.0–0.7)
Eosinophils Relative: 4 % (ref 0–5)
HEMATOCRIT: 44.6 % (ref 36.0–46.0)
HEMOGLOBIN: 14.8 g/dL (ref 12.0–15.0)
LYMPHS PCT: 26 % (ref 12–46)
Lymphs Abs: 2.5 10*3/uL (ref 0.7–4.0)
MCH: 29.4 pg (ref 26.0–34.0)
MCHC: 33.2 g/dL (ref 30.0–36.0)
MCV: 88.7 fL (ref 78.0–100.0)
MPV: 10.8 fL (ref 8.6–12.4)
Monocytes Absolute: 0.7 10*3/uL (ref 0.1–1.0)
Monocytes Relative: 7 % (ref 3–12)
NEUTROS ABS: 6 10*3/uL (ref 1.7–7.7)
NEUTROS PCT: 62 % (ref 43–77)
PLATELETS: 214 10*3/uL (ref 150–400)
RBC: 5.03 MIL/uL (ref 3.87–5.11)
RDW: 13.3 % (ref 11.5–15.5)
WBC: 9.6 10*3/uL (ref 4.0–10.5)

## 2015-03-18 ENCOUNTER — Encounter: Payer: Self-pay | Admitting: Family Medicine

## 2015-03-18 ENCOUNTER — Ambulatory Visit (INDEPENDENT_AMBULATORY_CARE_PROVIDER_SITE_OTHER): Payer: BLUE CROSS/BLUE SHIELD | Admitting: Family Medicine

## 2015-03-18 VITALS — BP 110/80 | HR 80 | Temp 98.5°F | Resp 20 | Wt 225.0 lb

## 2015-03-18 DIAGNOSIS — E038 Other specified hypothyroidism: Secondary | ICD-10-CM

## 2015-03-18 DIAGNOSIS — E785 Hyperlipidemia, unspecified: Secondary | ICD-10-CM

## 2015-03-18 DIAGNOSIS — Z23 Encounter for immunization: Secondary | ICD-10-CM

## 2015-03-18 DIAGNOSIS — E039 Hypothyroidism, unspecified: Secondary | ICD-10-CM

## 2015-03-18 DIAGNOSIS — E1100 Type 2 diabetes mellitus with hyperosmolarity without nonketotic hyperglycemic-hyperosmolar coma (NKHHC): Secondary | ICD-10-CM | POA: Diagnosis not present

## 2015-03-18 NOTE — Addendum Note (Signed)
Addended by: Daylene Posey T on: 03/18/2015 01:59 PM   Modules accepted: Orders

## 2015-03-18 NOTE — Progress Notes (Signed)
Subjective:    Patient ID: Samantha Glover, female    DOB: 1965-10-20, 49 y.o.   MRN: [id number]  HPI 2/16 Patient is a very pleasant 49 year old white female who presents today to follow-up her hyperlipidemia, hypertension, and diabetes mellitus type 2. Since I last saw the patient she has gained a proximally 2 pounds. She admits that she is not exercising regularly. She is also not watching her diet as well as she should. She also reports severe fatigue. She reports excessive daytime sleepiness. She also reports trouble staying awake sometimes while she is driving. She does snore although her husband denies any symptoms or episodes of sleep apnea. She denies any chest pain shortness of breath or dyspnea on exertion. She denies any myalgias or right upper quadrant pain. She denies any polyuria, polydipsia, or blurred vision.  At that time, my plan was: Patient's blood pressures well controlled. I will make no changes in her blood pressure medication at the present time. Regarding her hyperlipidemia, I will check a fasting lipid panel. Goal LDL cholesterol is less than 100. Her diabetic foot exam is performed today and is normal. We had a long discussion about a low carbohydrate diet. I also recommended diet exercise and weight loss. I will check a hemoglobin A1c as well as a urine microalbumin. Goal hemoglobin A1c is less than 6.5. I will also monitor the patient's TSH given her fatigue. She has subclinical hypothyroidism. Patient's symptoms sound concerning for sleep apnea. I recommended a sleep study but the patient wants to defer that at the present time and try aggressive diet exercise and weight loss first. If symptoms worsen she will then consent to a sleep study.  03/18/15 Wt Readings from Last 3 Encounters:  03/18/15 225 lb (102.059 kg)  06/11/14 218 lb (98.884 kg)  02/22/14 215 lb (97.523 kg)   patient has gained 7 pounds since her last office visit. Her hemoglobin A1c has risen substantially  from 6.7-8.5. She is not exercising. She is not watching her diet. She does report fatigue as well as patches of alopecia in her eyelashes which are small but noticeable. She denies any other hair loss in other parts of her body. She denies any chest pain shortness of breath or dyspnea on exertion. She denies any polyuria, polydipsia, or blurred vision. She denies any myalgias or right upper quadrant pain. Her most recent lab work is listed below in addition to the elevated hemoglobin A1c is also significant for mildly elevated TSH along with mild elevations in her liver function test.  Past Medical History  Diagnosis Date  . Diabetes mellitus without complication ([place])   . Hypercholesterolemia   . Hypertension   . Thyroid disorder     hypothyroidism  . ADD (attention deficit disorder)    Past Surgical History  Procedure Laterality Date  . Cervix surgery    . Breast surgery    . Eye surgery     Current Outpatient Prescriptions on File Prior to Visit  Medication Sig Dispense Refill  . atorvastatin (LIPITOR) 20 MG tablet Take 1 tablet (20 mg total) by mouth daily. 90 tablet 0  . Diethylpropion HCl 25 MG TABS Take 25 mg by mouth.    . losartan (COZAAR) 50 MG tablet Take 1 tablet (50 mg total) by mouth daily. 90 tablet 0  . triamcinolone (NASACORT) 55 MCG/ACT nasal inhaler Place 2 sprays into the nose daily.     No current facility-administered medications on file prior to visit.   No  Known Allergies Social History   Social History  . Marital Status: Married    Spouse Name: N/A  . Number of Children: N/A  . Years of Education: N/A   Occupational History  . Not on file.   Social History Main Topics  . Smoking status: Former Research scientist (life sciences)  . Smokeless tobacco: Never Used  . Alcohol Use: No  . Drug Use: No  . Sexual Activity: Not on file     Comment: married.  2 sons.  Currently, stay at home mom.   Other Topics Concern  . Not on file   Social History Narrative      Review of  Systems  All other systems reviewed and are negative.      Objective:   Physical Exam  Constitutional: She appears well-developed and well-nourished. No distress.  Neck: Neck supple. No JVD present. No thyromegaly present.  Cardiovascular: Normal rate, regular rhythm and normal heart sounds.   No murmur heard. Pulmonary/Chest: Effort normal and breath sounds normal. No respiratory distress. She has no wheezes. She has no rales.  Abdominal: Soft. Bowel sounds are normal. She exhibits no distension. There is no tenderness. There is no rebound and no guarding.  Musculoskeletal: She exhibits no edema.  Lymphadenopathy:    She has no cervical adenopathy.  Skin: She is not diaphoretic.  Vitals reviewed.         Assessment & Plan:  Uncontrolled type 2 diabetes mellitus with hyperosmolarity without coma, without long-term current use of insulin (HCC)  Subclinical hypothyroidism  HLD (hyperlipidemia)  exam is suspicious for a 5 mm erythematous scaly papule on the dorsum of her left shin that I recommended a shave biopsy for which she can schedule another time. Diabetes is uncontrolled. We discussed metformin but ultimately the patient decided she wanted to try invokana 300 mg a day and recheck a hemoglobin A1c and fasting lipid panel and liver function test in 3 months. I stressed the need to lose 15-20 pounds. I believe elevations in her liver function tests are likely due to her metabolic syndrome/fatty liver disease although I cannot exclude a complication from the lipitor.  If the patient successfully loses 10-15 pounds in her liver function tests remain slightly elevated in 3 months I will need to discontinue the Lipitor. TSH shows subclinical hypothyroidism but at the present time the patient elects not to proceed with therapy. Recheck a TSH in 3 months. If it worsens, I will start the patient on levothyroxine 50 g by mouth daily

## 2015-05-12 ENCOUNTER — Ambulatory Visit (INDEPENDENT_AMBULATORY_CARE_PROVIDER_SITE_OTHER): Payer: BLUE CROSS/BLUE SHIELD | Admitting: Family Medicine

## 2015-05-12 ENCOUNTER — Other Ambulatory Visit: Payer: Self-pay | Admitting: Family Medicine

## 2015-05-12 ENCOUNTER — Encounter: Payer: Self-pay | Admitting: Family Medicine

## 2015-05-12 VITALS — BP 128/70 | HR 104 | Temp 101.2°F | Resp 18 | Ht 63.0 in | Wt 221.0 lb

## 2015-05-12 DIAGNOSIS — R5081 Fever presenting with conditions classified elsewhere: Secondary | ICD-10-CM | POA: Diagnosis not present

## 2015-05-12 DIAGNOSIS — J208 Acute bronchitis due to other specified organisms: Secondary | ICD-10-CM | POA: Diagnosis not present

## 2015-05-12 LAB — INFLUENZA A AND B AG, IMMUNOASSAY
INFLUENZA B ANTIGEN: NOT DETECTED
Influenza A Antigen: NOT DETECTED

## 2015-05-12 MED ORDER — LEVOFLOXACIN 500 MG PO TABS: 500.0000 mg | ORAL_TABLET | Freq: Every day | ORAL | Status: DC

## 2015-05-12 NOTE — Progress Notes (Signed)
Subjective:    Patient ID: Samantha Glover, female    DOB: August 09, 1965, 50 y.o.   MRN: [id number]  HPI Was seen 12/27 at an urgent care and was given bactrim for a sinus infection with prednisone.  The patient took Bactrim immediately and felt better. She did not take the prednisone. She completed the antibiotics and felt fine for 1 week. After discontinuing antibiotics, she developed a cough with some wheezing. She began taking the prednisone. Over the last week the cough is worse in. Beginning Friday she developed fever and chills and rigors.  This morning she has a fever to 101.2 with chest tightness, and mild shortness of breath. She denies any hemoptysis or purulent sputum. She reports diffuse body aches. Her flu test is negative. She denies any rhinorrhea or sore throat or sinus pain. She did have some blurry vision last week while she was on the prednisone. Of note she is a diabetic. However her vision has returned to normal over the weekend. Today on exam she is 20/15 in both eyes. Past Medical History  Diagnosis Date  . Diabetes mellitus without complication ([place])   . Hypercholesterolemia   . Hypertension   . Thyroid disorder     hypothyroidism  . ADD (attention deficit disorder)    Past Surgical History  Procedure Laterality Date  . Cervix surgery    . Breast surgery    . Eye surgery     Current Outpatient Prescriptions on File Prior to Visit  Medication Sig Dispense Refill  . atorvastatin (LIPITOR) 20 MG tablet Take 1 tablet (20 mg total) by mouth daily. 90 tablet 0  . Diethylpropion HCl 25 MG TABS Take 25 mg by mouth.    . losartan (COZAAR) 50 MG tablet Take 1 tablet (50 mg total) by mouth daily. 90 tablet 0  . triamcinolone (NASACORT) 55 MCG/ACT nasal inhaler Place 2 sprays into the nose daily.     No current facility-administered medications on file prior to visit.   No Known Allergies Social History   Social History  . Marital Status: Married    Spouse Name: N/A  .  Number of Children: N/A  . Years of Education: N/A   Occupational History  . Not on file.   Social History Main Topics  . Smoking status: Former Research scientist (life sciences)  . Smokeless tobacco: Never Used  . Alcohol Use: No  . Drug Use: No  . Sexual Activity: Not on file     Comment: married.  2 sons.  Currently, stay at home mom.   Other Topics Concern  . Not on file   Social History Narrative      Review of Systems  All other systems reviewed and are negative.      Objective:   Physical Exam  Constitutional: She appears ill.  HENT:  Right Ear: External ear normal.  Left Ear: External ear normal.  Nose: Nose normal.  Mouth/Throat: Oropharynx is clear and moist. No oropharyngeal exudate.  Eyes: Conjunctivae, EOM and lids are normal. Pupils are equal, round, and reactive to light. Right conjunctiva is not injected. Right conjunctiva has no hemorrhage. Left conjunctiva is not injected. Left conjunctiva has no hemorrhage.  Neck: Neck supple.  Cardiovascular: Normal rate, regular rhythm and normal heart sounds.   Pulmonary/Chest: Effort normal and breath sounds normal. No respiratory distress. She has no wheezes. She has no rales.  Abdominal: Soft. Bowel sounds are normal. She exhibits no distension. There is no tenderness. There is no rebound.  Lymphadenopathy:  She has no cervical adenopathy.  Skin: No rash noted.  Vitals reviewed.         Assessment & Plan:  Fever presenting with conditions classified elsewhere - Plan: Influenza a and b  Acute bronchitis due to other specified organisms - Plan: levofloxacin (LEVAQUIN) 500 MG tablet  Flu test is negative. I believe that the patient's immune system is compromised by the prednisone and she likely cause a secondary infection which I believe is bacterial bronchitis. Begin Levaquin 500 mg by mouth daily for 7 days. She can use Mucinex DM for cough. I also recommended that she see her eye doctor for formal eye exam although I believe  that the blurry vision which was transient and may have been related to the hyperglycemia due to the prednisone. She states that she will call her eye doctor and schedule an appointment.

## 2015-06-16 ENCOUNTER — Other Ambulatory Visit: Payer: Self-pay | Admitting: Family Medicine

## 2015-06-16 NOTE — Telephone Encounter (Signed)
Medication refilled per protocol. 

## 2015-06-20 ENCOUNTER — Ambulatory Visit (INDEPENDENT_AMBULATORY_CARE_PROVIDER_SITE_OTHER): Payer: BLUE CROSS/BLUE SHIELD | Admitting: Family Medicine

## 2015-06-20 ENCOUNTER — Encounter: Payer: Self-pay | Admitting: Family Medicine

## 2015-06-20 VITALS — BP 118/70 | HR 80 | Temp 97.4°F | Resp 18 | Wt 219.0 lb

## 2015-06-20 DIAGNOSIS — E039 Hypothyroidism, unspecified: Secondary | ICD-10-CM | POA: Diagnosis not present

## 2015-06-20 DIAGNOSIS — E785 Hyperlipidemia, unspecified: Secondary | ICD-10-CM

## 2015-06-20 DIAGNOSIS — E119 Type 2 diabetes mellitus without complications: Secondary | ICD-10-CM

## 2015-06-20 DIAGNOSIS — D485 Neoplasm of uncertain behavior of skin: Secondary | ICD-10-CM

## 2015-06-20 NOTE — Progress Notes (Signed)
Subjective:    Patient ID: Samantha Glover, female    DOB: Sep 08, 1965, 50 y.o.   MRN: [id number]  HPI 2/16 Patient is a very pleasant 50 year old white female who presents today to follow-up her hyperlipidemia, hypertension, and diabetes mellitus type 2. Since I last saw the patient she has gained a proximally 2 pounds. She admits that she is not exercising regularly. She is also not watching her diet as well as she should. She also reports severe fatigue. She reports excessive daytime sleepiness. She also reports trouble staying awake sometimes while she is driving. She does snore although her husband denies any symptoms or episodes of sleep apnea. She denies any chest pain shortness of breath or dyspnea on exertion. She denies any myalgias or right upper quadrant pain. She denies any polyuria, polydipsia, or blurred vision.  At that time, my plan was: Patient's blood pressures well controlled. I will make no changes in her blood pressure medication at the present time. Regarding her hyperlipidemia, I will check a fasting lipid panel. Goal LDL cholesterol is less than 100. Her diabetic foot exam is performed today and is normal. We had a long discussion about a low carbohydrate diet. I also recommended diet exercise and weight loss. I will check a hemoglobin A1c as well as a urine microalbumin. Goal hemoglobin A1c is less than 6.5. I will also monitor the patient's TSH given her fatigue. She has subclinical hypothyroidism. Patient's symptoms sound concerning for sleep apnea. I recommended a sleep study but the patient wants to defer that at the present time and try aggressive diet exercise and weight loss first. If symptoms worsen she will then consent to a sleep study.  03/18/15 Wt Readings from Last 3 Encounters:  06/20/15 219 lb (99.338 kg)  05/12/15 221 lb (100.245 kg)  03/18/15 225 lb (102.059 kg)   patient has gained 7 pounds since her last office visit. Her hemoglobin A1c has risen  substantially from 6.7-8.5. She is not exercising. She is not watching her diet. She does report fatigue as well as patches of alopecia in her eyelashes which are small but noticeable. She denies any other hair loss in other parts of her body. She denies any chest pain shortness of breath or dyspnea on exertion. She denies any polyuria, polydipsia, or blurred vision. She denies any myalgias or right upper quadrant pain. Her most recent lab work is listed below in addition to the elevated hemoglobin A1c is also significant for mildly elevated TSH along with mild elevations in her liver function test.  At that time, my plan was:  exam is suspicious for a 5 mm erythematous scaly papule on the dorsum of her left shin that I recommended a shave biopsy for which she can schedule another time. Diabetes is uncontrolled. We discussed metformin but ultimately the patient decided she wanted to try invokana 300 mg a day and recheck a hemoglobin A1c and fasting lipid panel and liver function test in 3 months. I stressed the need to lose 15-20 pounds. I believe elevations in her liver function tests are likely due to her metabolic syndrome/fatty liver disease although I cannot exclude a complication from the lipitor.  If the patient successfully loses 10-15 pounds in her liver function tests remain slightly elevated in 3 months I will need to discontinue the Lipitor. TSH shows subclinical hypothyroidism but at the present time the patient elects not to proceed with therapy. Recheck a TSH in 3 months. If it worsens, I will start the  patient on levothyroxine 50 g by mouth daily  06/20/15  patient is here today requesting a shave biopsy of the lesion on her left shin. There is a 1.5 cm erythematous scaly brown based papule on the dorsum of her left shin. It appears to be an irritated seborrheic keratosis but can also be a squamous cell carcinoma. She would like to have a shave biopsy performed of this. She is also due to recheck  her fasting lipid panel along with her hemoglobin A1c. She continues to suffer from subclinical hyperthyroidism. However she reports more fatigue more difficulty losing weight , more lethargy, a she is interested in possibly receiving treatment for the subclinical hypothyroidism.  Past Medical History  Diagnosis Date  . Diabetes mellitus without complication ([place])   . Hypercholesterolemia   . Hypertension   . Thyroid disorder     hypothyroidism  . ADD (attention deficit disorder)    Past Surgical History  Procedure Laterality Date  . Cervix surgery    . Breast surgery    . Eye surgery     Current Outpatient Prescriptions on File Prior to Visit  Medication Sig Dispense Refill  . atorvastatin (LIPITOR) 20 MG tablet TAKE 1 BY MOUTH DAILY *NEED APPOINTMENT WITH MD* 90 tablet 0  . losartan (COZAAR) 50 MG tablet TAKE 1 BY MOUTH DAILY 90 tablet 0   No current facility-administered medications on file prior to visit.   No Known Allergies Social History   Social History  . Marital Status: Married    Spouse Name: N/A  . Number of Children: N/A  . Years of Education: N/A   Occupational History  . Not on file.   Social History Main Topics  . Smoking status: Former Research scientist (life sciences)  . Smokeless tobacco: Never Used  . Alcohol Use: No  . Drug Use: No  . Sexual Activity: Not on file     Comment: married.  2 sons.  Currently, stay at home mom.   Other Topics Concern  . Not on file   Social History Narrative      Review of Systems  All other systems reviewed and are negative.      Objective:   Physical Exam  Constitutional: She appears well-developed and well-nourished. No distress.  Neck: Neck supple. No JVD present. No thyromegaly present.  Cardiovascular: Normal rate, regular rhythm and normal heart sounds.   No murmur heard. Pulmonary/Chest: Effort normal and breath sounds normal. No respiratory distress. She has no wheezes. She has no rales.  Abdominal: Soft. Bowel sounds are  normal. She exhibits no distension. There is no tenderness. There is no rebound and no guarding.  Musculoskeletal: She exhibits no edema.  Lymphadenopathy:    She has no cervical adenopathy.  Skin: She is not diaphoretic.  Vitals reviewed.         Assessment & Plan:  Neoplasm of uncertain behavior of skin - Plan: Pathology  Type 2 diabetes mellitus without complication, without long-term current use of insulin ([place]) - Plan: COMPLETE METABOLIC PANEL WITH GFR, CBC with Differential/Platelet, Hemoglobin A1c, Lipid panel, Microalbumin, urine  Hypothyroidism, unspecified hypothyroidism type - Plan: TSH  HLD (hyperlipidemia) - Plan: Lipid panel  blood pressure is excellent. I would like the patient to return fasting for a fasting lipid panel. Goal LDL cholesterol is less than 100. Goal hemoglobin A1c is less than 6.5. I will recheck a TSH for her subclinical hyperthyroidism. However based on symptoms, the patient may benefit from treatment. Tentatively, I am planning on starting  the patient on levothyroxine 50 g a day and rechecking her TSH in 6 weeks. I will gradually increase levothyroxine until I achieved a TSH between 1 and 2.   Using sterile technique, I anesthetized the lesion on the left shin was 0.1% lidocaine with epinephrine. I performed a shave biopsy and sent the lesion to pathology in a labeled container. Hemostasis was achieved with Drysol and a Band-Aid.

## 2015-06-23 ENCOUNTER — Other Ambulatory Visit: Payer: BLUE CROSS/BLUE SHIELD

## 2015-06-23 DIAGNOSIS — E785 Hyperlipidemia, unspecified: Secondary | ICD-10-CM

## 2015-06-23 DIAGNOSIS — E119 Type 2 diabetes mellitus without complications: Secondary | ICD-10-CM

## 2015-06-23 DIAGNOSIS — I10 Essential (primary) hypertension: Secondary | ICD-10-CM

## 2015-06-23 DIAGNOSIS — Z79899 Other long term (current) drug therapy: Secondary | ICD-10-CM

## 2015-06-23 LAB — CBC WITH DIFFERENTIAL/PLATELET
Basophils Absolute: 0.1 10*3/uL (ref 0.0–0.1)
Basophils Relative: 1 % (ref 0–1)
EOS ABS: 0.2 10*3/uL (ref 0.0–0.7)
Eosinophils Relative: 3 % (ref 0–5)
HCT: 43.3 % (ref 36.0–46.0)
HEMOGLOBIN: 14.9 g/dL (ref 12.0–15.0)
Lymphocytes Relative: 39 % (ref 12–46)
Lymphs Abs: 2.6 10*3/uL (ref 0.7–4.0)
MCH: 30.8 pg (ref 26.0–34.0)
MCHC: 34.4 g/dL (ref 30.0–36.0)
MCV: 89.5 fL (ref 78.0–100.0)
MONO ABS: 0.5 10*3/uL (ref 0.1–1.0)
MONOS PCT: 7 % (ref 3–12)
MPV: 10.3 fL (ref 8.6–12.4)
NEUTROS ABS: 3.3 10*3/uL (ref 1.7–7.7)
NEUTROS PCT: 50 % (ref 43–77)
PLATELETS: 218 10*3/uL (ref 150–400)
RBC: 4.84 MIL/uL (ref 3.87–5.11)
RDW: 13 % (ref 11.5–15.5)
WBC: 6.6 10*3/uL (ref 4.0–10.5)

## 2015-06-23 LAB — COMPLETE METABOLIC PANEL WITH GFR
ALK PHOS: 80 U/L (ref 33–115)
ALT: 75 U/L — ABNORMAL HIGH (ref 6–29)
AST: 36 U/L — ABNORMAL HIGH (ref 10–35)
Albumin: 4.2 g/dL (ref 3.6–5.1)
BILIRUBIN TOTAL: 0.7 mg/dL (ref 0.2–1.2)
BUN: 9 mg/dL (ref 7–25)
CALCIUM: 9.6 mg/dL (ref 8.6–10.2)
CO2: 28 mmol/L (ref 20–31)
Chloride: 102 mmol/L (ref 98–110)
Creat: 0.69 mg/dL (ref 0.50–1.10)
GFR, Est African American: >89 mL/min (ref >=60)
GLUCOSE: 150 mg/dL — AB (ref 70–99)
POTASSIUM: 4.6 mmol/L (ref 3.5–5.3)
Sodium: 136 mmol/L (ref 135–146)
TOTAL PROTEIN: 6.9 g/dL (ref 6.1–8.1)

## 2015-06-23 LAB — LIPID PANEL
Cholesterol: 159 mg/dL (ref 125–200)
HDL: 30 mg/dL — ABNORMAL LOW (ref >=46)
LDL CALC: 108 mg/dL (ref <130)
Total CHOL/HDL Ratio: 5.3 Ratio — ABNORMAL HIGH (ref <=5.0)
Triglycerides: 104 mg/dL (ref <150)
VLDL: 21 mg/dL (ref <30)

## 2015-06-23 LAB — TSH: TSH: 3.04 m[IU]/L

## 2015-06-24 ENCOUNTER — Encounter: Payer: Self-pay | Admitting: Family Medicine

## 2015-06-24 LAB — HEMOGLOBIN A1C
HEMOGLOBIN A1C: 9.4 % — AB (ref <5.7)
MEAN PLASMA GLUCOSE: 223 mg/dL — AB (ref <117)

## 2015-06-24 LAB — PATHOLOGY

## 2015-06-24 LAB — MICROALBUMIN, URINE: MICROALB UR: 0.4 mg/dL

## 2015-07-01 ENCOUNTER — Other Ambulatory Visit: Payer: Self-pay | Admitting: Family Medicine

## 2015-07-01 MED ORDER — METFORMIN HCL 500 MG PO TABS: ORAL_TABLET | ORAL | Status: DC

## 2015-07-01 MED ORDER — EMPAGLIFLOZIN 25 MG PO TABS: 25.0000 mg | ORAL_TABLET | Freq: Every day | ORAL | Status: DC

## 2015-07-15 ENCOUNTER — Ambulatory Visit: Payer: BLUE CROSS/BLUE SHIELD | Admitting: Family Medicine

## 2015-07-30 ENCOUNTER — Telehealth: Payer: Self-pay | Admitting: Family Medicine

## 2015-07-30 NOTE — Telephone Encounter (Signed)
Patient is now using [facility] for her medications: her member ID is [id number] group # [id number] bin # [phone] and PCN # [id number]  empagliflozin (JARDIANCE) 25 MG TABS atorvastatin (LIPITOR) 20 MG tablet  losartan (COZAAR) 50 MG tablet    metFORMIN (GLUCOPHAGE) 500 MG tablet  She isn't out of any but would like [place] to go ahead and send in for next time she needs them.  CB# [phone]

## 2015-08-01 MED ORDER — LOSARTAN POTASSIUM 50 MG PO TABS: ORAL_TABLET | ORAL | Status: DC

## 2015-08-01 MED ORDER — EMPAGLIFLOZIN 25 MG PO TABS: 25.0000 mg | ORAL_TABLET | Freq: Every day | ORAL | Status: DC

## 2015-08-01 MED ORDER — METFORMIN HCL 500 MG PO TABS: ORAL_TABLET | ORAL | Status: DC

## 2015-08-01 MED ORDER — ATORVASTATIN CALCIUM 20 MG PO TABS: ORAL_TABLET | ORAL | Status: DC

## 2015-08-01 NOTE — Telephone Encounter (Signed)
Medication called/sent to requested pharmacy  

## 2015-09-01 ENCOUNTER — Telehealth: Payer: Self-pay | Admitting: Family Medicine

## 2015-09-01 NOTE — Telephone Encounter (Signed)
Pharmacy sent 9 pills out to patient - I will call and see if we can fix this mistake

## 2015-09-01 NOTE — Telephone Encounter (Signed)
[phone] Patient calling to discuss getting the wrong amount of jardince  in the mail, she gets mail order and did not get enough of this

## 2015-09-03 MED ORDER — EMPAGLIFLOZIN 25 MG PO TABS: 25.0000 mg | ORAL_TABLET | Freq: Every day | ORAL | Status: DC

## 2015-09-03 NOTE — Telephone Encounter (Signed)
[facility]ed [facility] and they will refund $63 and will need new rx. New RX sent to pharm and pt aware.

## 2015-10-06 ENCOUNTER — Ambulatory Visit: Payer: BLUE CROSS/BLUE SHIELD | Admitting: Family Medicine

## 2015-10-14 ENCOUNTER — Other Ambulatory Visit: Payer: Managed Care, Other (non HMO)

## 2015-10-14 ENCOUNTER — Other Ambulatory Visit: Payer: Self-pay | Admitting: Family Medicine

## 2015-10-14 DIAGNOSIS — Z79899 Other long term (current) drug therapy: Secondary | ICD-10-CM

## 2015-10-14 DIAGNOSIS — E119 Type 2 diabetes mellitus without complications: Secondary | ICD-10-CM

## 2015-10-14 DIAGNOSIS — I10 Essential (primary) hypertension: Secondary | ICD-10-CM

## 2015-10-14 DIAGNOSIS — E785 Hyperlipidemia, unspecified: Secondary | ICD-10-CM

## 2015-10-14 LAB — CBC WITH DIFFERENTIAL/PLATELET
BASOS ABS: 70 {cells}/uL (ref 0–200)
Basophils Relative: 1 %
EOS ABS: 280 {cells}/uL (ref 15–500)
Eosinophils Relative: 4 %
HCT: 46.9 % — ABNORMAL HIGH (ref 35.0–45.0)
Hemoglobin: 15.7 g/dL — ABNORMAL HIGH (ref 12.0–15.0)
LYMPHS PCT: 35 %
Lymphs Abs: 2450 cells/uL (ref 850–3900)
MCH: 30.2 pg (ref 27.0–33.0)
MCHC: 33.5 g/dL (ref 32.0–36.0)
MCV: 90.2 fL (ref 80.0–100.0)
MONOS PCT: 7 %
MPV: 10.1 fL (ref 7.5–12.5)
Monocytes Absolute: 490 cells/uL (ref 200–950)
NEUTROS PCT: 53 %
Neutro Abs: 3710 cells/uL (ref 1500–7800)
PLATELETS: 225 10*3/uL (ref 140–400)
RBC: 5.2 MIL/uL — ABNORMAL HIGH (ref 3.80–5.10)
RDW: 13 % (ref 11.0–15.0)
WBC: 7 10*3/uL (ref 3.8–10.8)

## 2015-10-14 LAB — COMPREHENSIVE METABOLIC PANEL
ALT: 25 U/L (ref 6–29)
AST: 17 U/L (ref 10–35)
Albumin: 4.5 g/dL (ref 3.6–5.1)
Alkaline Phosphatase: 78 U/L (ref 33–130)
BUN: 13 mg/dL (ref 7–25)
CHLORIDE: 101 mmol/L (ref 98–110)
CO2: 26 mmol/L (ref 20–31)
Calcium: 9.7 mg/dL (ref 8.6–10.4)
Creat: 0.78 mg/dL (ref 0.50–1.05)
Glucose, Bld: 125 mg/dL — ABNORMAL HIGH (ref 70–99)
POTASSIUM: 4.5 mmol/L (ref 3.5–5.3)
Sodium: 142 mmol/L (ref 135–146)
TOTAL PROTEIN: 7.4 g/dL (ref 6.1–8.1)
Total Bilirubin: 0.5 mg/dL (ref 0.2–1.2)

## 2015-10-14 LAB — LIPID PANEL
CHOL/HDL RATIO: 4 ratio (ref <=5.0)
CHOLESTEROL: 175 mg/dL (ref 125–200)
HDL: 44 mg/dL — ABNORMAL LOW (ref >=46)
LDL CALC: 103 mg/dL (ref <130)
Triglycerides: 140 mg/dL (ref <150)
VLDL: 28 mg/dL (ref <30)

## 2015-10-14 LAB — HEMOGLOBIN A1C
HEMOGLOBIN A1C: 5.9 % — AB (ref <5.7)
Mean Plasma Glucose: 123 mg/dL

## 2015-10-17 ENCOUNTER — Encounter: Payer: Self-pay | Admitting: Family Medicine

## 2015-10-17 ENCOUNTER — Ambulatory Visit (INDEPENDENT_AMBULATORY_CARE_PROVIDER_SITE_OTHER): Payer: Managed Care, Other (non HMO) | Admitting: Family Medicine

## 2015-10-17 VITALS — BP 118/74 | HR 78 | Temp 97.9°F | Resp 16 | Ht 63.0 in | Wt 206.0 lb

## 2015-10-17 DIAGNOSIS — E785 Hyperlipidemia, unspecified: Secondary | ICD-10-CM | POA: Diagnosis not present

## 2015-10-17 DIAGNOSIS — E039 Hypothyroidism, unspecified: Secondary | ICD-10-CM | POA: Diagnosis not present

## 2015-10-17 DIAGNOSIS — I10 Essential (primary) hypertension: Secondary | ICD-10-CM

## 2015-10-17 DIAGNOSIS — E119 Type 2 diabetes mellitus without complications: Secondary | ICD-10-CM | POA: Diagnosis not present

## 2015-10-17 LAB — TSH: TSH: 5.33 m[IU]/L — AB

## 2015-10-17 NOTE — Progress Notes (Signed)
Subjective:    Patient ID: Samantha Glover, female    DOB: 20-Jul-1965, 50 y.o.   MRN: [id number]  HPI 2/16 Patient is a very pleasant 50 year old white female who presents today to follow-up her hyperlipidemia, hypertension, and diabetes mellitus type 2. Since I last saw the patient she has gained a proximally 2 pounds. She admits that she is not exercising regularly. She is also not watching her diet as well as she should. She also reports severe fatigue. She reports excessive daytime sleepiness. She also reports trouble staying awake sometimes while she is driving. She does snore although her husband denies any symptoms or episodes of sleep apnea. She denies any chest pain shortness of breath or dyspnea on exertion. She denies any myalgias or right upper quadrant pain. She denies any polyuria, polydipsia, or blurred vision.  At that time, my plan was: Patient's blood pressures well controlled. I will make no changes in her blood pressure medication at the present time. Regarding her hyperlipidemia, I will check a fasting lipid panel. Goal LDL cholesterol is less than 100. Her diabetic foot exam is performed today and is normal. We had a long discussion about a low carbohydrate diet. I also recommended diet exercise and weight loss. I will check a hemoglobin A1c as well as a urine microalbumin. Goal hemoglobin A1c is less than 6.5. I will also monitor the patient's TSH given her fatigue. She has subclinical hypothyroidism. Patient's symptoms sound concerning for sleep apnea. I recommended a sleep study but the patient wants to defer that at the present time and try aggressive diet exercise and weight loss first. If symptoms worsen she will then consent to a sleep study.  03/18/15 Wt Readings from Last 3 Encounters:  10/17/15 206 lb (93.441 kg)  06/20/15 219 lb (99.338 kg)  05/12/15 221 lb (100.245 kg)   patient has gained 7 pounds since her last office visit. Her hemoglobin A1c has risen substantially  from 6.7-8.5. She is not exercising. She is not watching her diet. She does report fatigue as well as patches of alopecia in her eyelashes which are small but noticeable. She denies any other hair loss in other parts of her body. She denies any chest pain shortness of breath or dyspnea on exertion. She denies any polyuria, polydipsia, or blurred vision. She denies any myalgias or right upper quadrant pain. Her most recent lab work is listed below in addition to the elevated hemoglobin A1c is also significant for mildly elevated TSH along with mild elevations in her liver function test.  At that time, my plan was:  exam is suspicious for a 5 mm erythematous scaly papule on the dorsum of her left shin that I recommended a shave biopsy for which she can schedule another time. Diabetes is uncontrolled. We discussed metformin but ultimately the patient decided she wanted to try invokana 300 mg a day and recheck a hemoglobin A1c and fasting lipid panel and liver function test in 3 months. I stressed the need to lose 15-20 pounds. I believe elevations in her liver function tests are likely due to her metabolic syndrome/fatty liver disease although I cannot exclude a complication from the lipitor.  If the patient successfully loses 10-15 pounds in her liver function tests remain slightly elevated in 3 months I will need to discontinue the Lipitor. TSH shows subclinical hypothyroidism but at the present time the patient elects not to proceed with therapy. Recheck a TSH in 3 months. If it worsens, I will start the  patient on levothyroxine 50 g by mouth daily  06/20/15  patient is here today requesting a shave biopsy of the lesion on her left shin. There is a 1.5 cm erythematous scaly brown based papule on the dorsum of her left shin. It appears to be an irritated seborrheic keratosis but can also be a squamous cell carcinoma. She would like to have a shave biopsy performed of this. She is also due to recheck her fasting  lipid panel along with her hemoglobin A1c. She continues to suffer from subclinical hyperthyroidism. However she reports more fatigue more difficulty losing weight , more lethargy, a she is interested in possibly receiving treatment for the subclinical hypothyroidism.  AT that time, my plan was: Blood pressure is excellent. I would like the patient to return fasting for a fasting lipid panel. Goal LDL cholesterol is less than 100. Goal hemoglobin A1c is less than 6.5. I will recheck a TSH for her subclinical hyperthyroidism. However based on symptoms, the patient may benefit from treatment. Tentatively, I am planning on starting the patient on levothyroxine 50 g a day and rechecking her TSH in 6 weeks. I will gradually increase levothyroxine until I achieved a TSH between 1 and 2.   Using sterile technique, I anesthetized the lesion on the left shin was 0.1% lidocaine with epinephrine. I performed a shave biopsy and sent the lesion to pathology in a labeled container. Hemostasis was achieved with Drysol and a Band-Aid.  10/17/15 Here for follow up.  Most recent labs are listed below: Appointment on 10/14/2015  Component Date Value Ref Range Status  . WBC 10/14/2015 7.0  3.8 - 10.8 K/uL Final  . RBC 10/14/2015 5.20* 3.80 - 5.10 MIL/uL Final  . Hemoglobin 10/14/2015 15.7* 12.0 - 15.0 g/dL Final  . HCT 10/14/2015 46.9* 35.0 - 45.0 % Final  . MCV 10/14/2015 90.2  80.0 - 100.0 fL Final  . MCH 10/14/2015 30.2  27.0 - 33.0 pg Final  . MCHC 10/14/2015 33.5  32.0 - 36.0 g/dL Final  . RDW 10/14/2015 13.0  11.0 - 15.0 % Final  . Platelets 10/14/2015 225  140 - 400 K/uL Final  . MPV 10/14/2015 10.1  7.5 - 12.5 fL Final  . Neutro Abs 10/14/2015 3710  1500 - 7800 cells/uL Final  . Lymphs Abs 10/14/2015 2450  850 - 3900 cells/uL Final  . Monocytes Absolute 10/14/2015 490  200 - 950 cells/uL Final  . Eosinophils Absolute 10/14/2015 280  15 - 500 cells/uL Final  . Basophils Absolute 10/14/2015 70  0 - 200  cells/uL Final  . Neutrophils Relative % 10/14/2015 53   Final  . Lymphocytes Relative 10/14/2015 35   Final  . Monocytes Relative 10/14/2015 7   Final  . Eosinophils Relative 10/14/2015 4   Final  . Basophils Relative 10/14/2015 1   Final  . Smear Review 10/14/2015 Criteria for review not met   Final   ** Please note change in unit of measure and reference range(s). **  . Sodium 10/14/2015 142  135 - 146 mmol/L Final  . Potassium 10/14/2015 4.5  3.5 - 5.3 mmol/L Final  . Chloride 10/14/2015 101  98 - 110 mmol/L Final  . CO2 10/14/2015 26  20 - 31 mmol/L Final  . Glucose, Bld 10/14/2015 125* 70 - 99 mg/dL Final  . BUN 10/14/2015 13  7 - 25 mg/dL Final  . Creat 10/14/2015 0.78  0.50 - 1.05 mg/dL Final   Comment:   For patients > or =  50 years of age: The upper reference limit for Creatinine is approximately 13% higher for people identified as African-American.     . Total Bilirubin 10/14/2015 0.5  0.2 - 1.2 mg/dL Final  . Alkaline Phosphatase 10/14/2015 78  33 - 130 U/L Final  . AST 10/14/2015 17  10 - 35 U/L Final  . ALT 10/14/2015 25  6 - 29 U/L Final  . Total Protein 10/14/2015 7.4  6.1 - 8.1 g/dL Final  . Albumin 10/14/2015 4.5  3.6 - 5.1 g/dL Final  . Calcium 10/14/2015 9.7  8.6 - 10.4 mg/dL Final  . Hgb A1c MFr Bld 10/14/2015 5.9* <5.7 % Final   Comment:   For someone without known diabetes, a hemoglobin A1c value between 5.7% and 6.4% is consistent with prediabetes and should be confirmed with a follow-up test.   For someone with known diabetes, a value <7% indicates that their diabetes is well controlled. A1c targets should be individualized based on duration of diabetes, age, co-morbid conditions and other considerations.   This assay result is consistent with an increased risk of diabetes.   Currently, no consensus exists regarding use of hemoglobin A1c for diagnosis of diabetes in children.     . Mean Plasma Glucose 10/14/2015 123   Final  . Cholesterol  10/14/2015 175  125 - 200 mg/dL Final  . Triglycerides 10/14/2015 140  <150 mg/dL Final  . HDL 10/14/2015 44* >=46 mg/dL Final  . Total CHOL/HDL Ratio 10/14/2015 4.0  <=5.0 Ratio Final  . VLDL 10/14/2015 28  <30 mg/dL Final  . LDL Cholesterol 10/14/2015 103  <130 mg/dL Final   Comment:   Total Cholesterol/HDL Ratio:CHD Risk                        Coronary Heart Disease Risk Table                                        Men       Women          1/2 Average Risk              3.4        3.3              Average Risk              5.0        4.4           2X Average Risk              9.6        7.1           3X Average Risk             23.4       11.0 Use the calculated Patient Ratio above and the CHD Risk table  to determine the patient's CHD Risk.     Has lost 15 pounds since her last office visit. Is exercising and dieting. Has become very active. Denies any chest pain shortness of breath or dyspnea on exertion. Denies any polyuria, polydipsia, or blurred vision. Denies any allergies or right upper quadrant pain Past Medical History  Diagnosis Date  . Diabetes mellitus without complication ([place])   . Hypercholesterolemia   . Hypertension   . Thyroid disorder     hypothyroidism  . ADD (  attention deficit disorder)    Past Surgical History  Procedure Laterality Date  . Cervix surgery    . Breast surgery    . Eye surgery     Current Outpatient Prescriptions on File Prior to Visit  Medication Sig Dispense Refill  . atorvastatin (LIPITOR) 20 MG tablet TAKE 1 BY MOUTH DAILY 90 tablet 1  . empagliflozin (JARDIANCE) 25 MG TABS tablet Take 25 mg by mouth daily. 90 tablet 1  . losartan (COZAAR) 50 MG tablet TAKE 1 BY MOUTH DAILY 90 tablet 1  . metFORMIN (GLUCOPHAGE) 500 MG tablet 2 tabs po bid 360 tablet 1   No current facility-administered medications on file prior to visit.   No Known Allergies Social History   Social History  . Marital Status: Married    Spouse Name: N/A  .  Number of Children: N/A  . Years of Education: N/A   Occupational History  . Not on file.   Social History Main Topics  . Smoking status: Former Research scientist (life sciences)  . Smokeless tobacco: Never Used  . Alcohol Use: No  . Drug Use: No  . Sexual Activity: Not on file     Comment: married.  2 sons.  Currently, stay at home mom.   Other Topics Concern  . Not on file   Social History Narrative      Review of Systems  All other systems reviewed and are negative.      Objective:   Physical Exam  Constitutional: She appears well-developed and well-nourished. No distress.  Neck: Neck supple. No JVD present. No thyromegaly present.  Cardiovascular: Normal rate, regular rhythm and normal heart sounds.   No murmur heard. Pulmonary/Chest: Effort normal and breath sounds normal. No respiratory distress. She has no wheezes. She has no rales.  Abdominal: Soft. Bowel sounds are normal. She exhibits no distension. There is no tenderness. There is no rebound and no guarding.  Musculoskeletal: She exhibits no edema.  Lymphadenopathy:    She has no cervical adenopathy.  Skin: She is not diaphoretic.  Vitals reviewed.         Assessment & Plan:  Type 2 diabetes mellitus without complication, without long-term current use of insulin ([facility])  Hypothyroidism, unspecified hypothyroidism type  HLD (hyperlipidemia)  Essential hypertension  I am very proud of this patient. Keep up the hard work. DecreaseJardiance to one half pill a day and recheck A1c in 6 months. Continue diet exercise and weight loss. I will add a TSH to lab work she had drawn for follow-up on her hypothyroidism.

## 2015-11-12 ENCOUNTER — Ambulatory Visit (INDEPENDENT_AMBULATORY_CARE_PROVIDER_SITE_OTHER): Payer: Managed Care, Other (non HMO) | Admitting: Family Medicine

## 2015-11-12 DIAGNOSIS — Z111 Encounter for screening for respiratory tuberculosis: Secondary | ICD-10-CM | POA: Diagnosis not present

## 2016-02-23 ENCOUNTER — Other Ambulatory Visit: Payer: Self-pay | Admitting: Family Medicine

## 2016-03-02 ENCOUNTER — Other Ambulatory Visit: Payer: Self-pay | Admitting: Family Medicine

## 2016-03-02 MED ORDER — METFORMIN HCL 500 MG PO TABS: ORAL_TABLET | ORAL | 0 refills | Status: DC

## 2016-05-14 ENCOUNTER — Other Ambulatory Visit: Payer: Self-pay | Admitting: Family Medicine

## 2016-05-14 MED ORDER — METFORMIN HCL 500 MG PO TABS: ORAL_TABLET | ORAL | 0 refills | Status: DC

## 2016-05-20 ENCOUNTER — Other Ambulatory Visit: Payer: Managed Care, Other (non HMO)

## 2016-05-20 DIAGNOSIS — E119 Type 2 diabetes mellitus without complications: Secondary | ICD-10-CM

## 2016-05-20 DIAGNOSIS — Z79899 Other long term (current) drug therapy: Secondary | ICD-10-CM

## 2016-05-20 DIAGNOSIS — I10 Essential (primary) hypertension: Secondary | ICD-10-CM

## 2016-05-20 DIAGNOSIS — E785 Hyperlipidemia, unspecified: Secondary | ICD-10-CM

## 2016-05-20 LAB — COMPREHENSIVE METABOLIC PANEL
ALT: 57 U/L — ABNORMAL HIGH (ref 6–29)
AST: 22 U/L (ref 10–35)
Albumin: 4.2 g/dL (ref 3.6–5.1)
Alkaline Phosphatase: 70 U/L (ref 33–130)
BILIRUBIN TOTAL: 0.6 mg/dL (ref 0.2–1.2)
BUN: 10 mg/dL (ref 7–25)
CO2: 23 mmol/L (ref 20–31)
CREATININE: 0.64 mg/dL (ref 0.50–1.05)
Calcium: 9.5 mg/dL (ref 8.6–10.4)
Chloride: 104 mmol/L (ref 98–110)
GLUCOSE: 128 mg/dL — AB (ref 70–99)
Potassium: 4.4 mmol/L (ref 3.5–5.3)
Sodium: 140 mmol/L (ref 135–146)
Total Protein: 7.2 g/dL (ref 6.1–8.1)

## 2016-05-20 LAB — CBC WITH DIFFERENTIAL/PLATELET
Basophils Absolute: 97 cells/uL (ref 0–200)
Basophils Relative: 1 %
Eosinophils Absolute: 291 cells/uL (ref 15–500)
Eosinophils Relative: 3 %
HCT: 44.9 % (ref 35.0–45.0)
Hemoglobin: 15.2 g/dL — ABNORMAL HIGH (ref 12.0–15.0)
LYMPHS PCT: 26 %
Lymphs Abs: 2522 cells/uL (ref 850–3900)
MCH: 29.8 pg (ref 27.0–33.0)
MCHC: 33.9 g/dL (ref 32.0–36.0)
MCV: 88 fL (ref 80.0–100.0)
MONOS PCT: 6 %
MPV: 10.2 fL (ref 7.5–12.5)
Monocytes Absolute: 582 cells/uL (ref 200–950)
NEUTROS PCT: 64 %
Neutro Abs: 6208 cells/uL (ref 1500–7800)
Platelets: 232 10*3/uL (ref 140–400)
RBC: 5.1 MIL/uL (ref 3.80–5.10)
RDW: 13.2 % (ref 11.0–15.0)
WBC: 9.7 10*3/uL (ref 3.8–10.8)

## 2016-05-20 LAB — LIPID PANEL
CHOL/HDL RATIO: 4.6 ratio (ref <5.0)
CHOLESTEROL: 164 mg/dL (ref <200)
HDL: 36 mg/dL — AB (ref >50)
LDL Cholesterol: 87 mg/dL (ref <100)
Triglycerides: 203 mg/dL — ABNORMAL HIGH (ref <150)
VLDL: 41 mg/dL — ABNORMAL HIGH (ref <30)

## 2016-05-21 LAB — HEMOGLOBIN A1C
Hgb A1c MFr Bld: 6.2 % — ABNORMAL HIGH (ref <5.7)
Mean Plasma Glucose: 131 mg/dL

## 2016-05-24 ENCOUNTER — Ambulatory Visit (INDEPENDENT_AMBULATORY_CARE_PROVIDER_SITE_OTHER): Payer: Managed Care, Other (non HMO) | Admitting: Family Medicine

## 2016-05-24 ENCOUNTER — Encounter: Payer: Self-pay | Admitting: Family Medicine

## 2016-05-24 VITALS — BP 142/90 | HR 84 | Temp 98.6°F | Resp 18 | Ht 63.0 in | Wt 219.0 lb

## 2016-05-24 DIAGNOSIS — Z23 Encounter for immunization: Secondary | ICD-10-CM | POA: Diagnosis not present

## 2016-05-24 DIAGNOSIS — E038 Other specified hypothyroidism: Secondary | ICD-10-CM

## 2016-05-24 DIAGNOSIS — I10 Essential (primary) hypertension: Secondary | ICD-10-CM | POA: Diagnosis not present

## 2016-05-24 DIAGNOSIS — E039 Hypothyroidism, unspecified: Secondary | ICD-10-CM | POA: Diagnosis not present

## 2016-05-24 DIAGNOSIS — E119 Type 2 diabetes mellitus without complications: Secondary | ICD-10-CM | POA: Diagnosis not present

## 2016-05-24 DIAGNOSIS — E7800 Pure hypercholesterolemia, unspecified: Secondary | ICD-10-CM

## 2016-05-24 NOTE — Progress Notes (Signed)
Subjective:    Patient ID: Samantha Glover, female    DOB: [birth year], 51 y.o.   MRN: [id number]  HPI2/16 Patient is a very pleasant 51 year old white female who presents today to follow-up her hyperlipidemia, hypertension, and diabetes mellitus type 2. Since I last saw the patient she has gained a proximally 2 pounds. She admits that she is not exercising regularly. She is also not watching her diet as well as she should. She also reports severe fatigue. She reports excessive daytime sleepiness. She also reports trouble staying awake sometimes while she is driving. She does snore although her husband denies any symptoms or episodes of sleep apnea. She denies any chest pain shortness of breath or dyspnea on exertion. She denies any myalgias or right upper quadrant pain. She denies any polyuria, polydipsia, or blurred vision.  At that time, my plan was: Patient's blood pressures well controlled. I will make no changes in her blood pressure medication at the present time. Regarding her hyperlipidemia, I will check a fasting lipid panel. Goal LDL cholesterol is less than 100. Her diabetic foot exam is performed today and is normal. We had a long discussion about a low carbohydrate diet. I also recommended diet exercise and weight loss. I will check a hemoglobin A1c as well as a urine microalbumin. Goal hemoglobin A1c is less than 6.5. I will also monitor the patient's TSH given her fatigue. She has subclinical hypothyroidism. Patient's symptoms sound concerning for sleep apnea. I recommended a sleep study but the patient wants to defer that at the present time and try aggressive diet exercise and weight loss first. If symptoms worsen she will then consent to a sleep study.  03/18/15 Wt Readings from Last 3 Encounters:  05/24/16 219 lb (99.3 kg)  10/17/15 206 lb (93.4 kg)  06/20/15 219 lb (99.3 kg)   patient has gained 7 pounds since her last office visit. Her hemoglobin A1c has risen substantially from  6.7-8.5. She is not exercising. She is not watching her diet. She does report fatigue as well as patches of alopecia in her eyelashes which are small but noticeable. She denies any other hair loss in other parts of her body. She denies any chest pain shortness of breath or dyspnea on exertion. She denies any polyuria, polydipsia, or blurred vision. She denies any myalgias or right upper quadrant pain. Her most recent lab work is listed below in addition to the elevated hemoglobin A1c is also significant for mildly elevated TSH along with mild elevations in her liver function test.  At that time, my plan was:  exam is suspicious for a 5 mm erythematous scaly papule on the dorsum of her left shin that I recommended a shave biopsy for which she can schedule another time. Diabetes is uncontrolled. We discussed metformin but ultimately the patient decided she wanted to try invokana 300 mg a day and recheck a hemoglobin A1c and fasting lipid panel and liver function test in 3 months. I stressed the need to lose 15-20 pounds. I believe elevations in her liver function tests are likely due to her metabolic syndrome/fatty liver disease although I cannot exclude a complication from the lipitor.  If the patient successfully loses 10-15 pounds in her liver function tests remain slightly elevated in 3 months I will need to discontinue the Lipitor. TSH shows subclinical hypothyroidism but at the present time the patient elects not to proceed with therapy. Recheck a TSH in 3 months. If it worsens, I will start the patient  on levothyroxine 50 g by mouth daily  06/20/15  patient is here today requesting a shave biopsy of the lesion on her left shin. There is a 1.5 cm erythematous scaly brown based papule on the dorsum of her left shin. It appears to be an irritated seborrheic keratosis but can also be a squamous cell carcinoma. She would like to have a shave biopsy performed of this. She is also due to recheck her fasting lipid  panel along with her hemoglobin A1c. She continues to suffer from subclinical hyperthyroidism. However she reports more fatigue more difficulty losing weight , more lethargy, a she is interested in possibly receiving treatment for the subclinical hypothyroidism.  AT that time, my plan was: Blood pressure is excellent. I would like the patient to return fasting for a fasting lipid panel. Goal LDL cholesterol is less than 100. Goal hemoglobin A1c is less than 6.5. I will recheck a TSH for her subclinical hyperthyroidism. However based on symptoms, the patient may benefit from treatment. Tentatively, I am planning on starting the patient on levothyroxine 50 g a day and rechecking her TSH in 6 weeks. I will gradually increase levothyroxine until I achieved a TSH between 1 and 2.   Using sterile technique, I anesthetized the lesion on the left shin was 0.1% lidocaine with epinephrine. I performed a shave biopsy and sent the lesion to pathology in a labeled container. Hemostasis was achieved with Drysol and a Band-Aid.  10/17/15 Has lost 15 pounds since her last office visit. Is exercising and dieting. Has become very active. Denies any chest pain shortness of breath or dyspnea on exertion. Denies any polyuria, polydipsia, or blurred vision. Denies any allergies or right upper quadrant pain.  AT that time, my plan was:  I am very proud of this patient. Keep up the hard work. DecreaseJardiance to one half pill a day and recheck A1c in 6 months. Continue diet exercise and weight loss. I will add a TSH to lab work she had drawn for follow-up on her hypothyroidism.  05/24/16 Here for follow up.  Most recent labs are listed below: Appointment on 05/20/2016  Component Date Value Ref Range Status  . WBC 05/20/2016 9.7  3.8 - 10.8 K/uL Final  . RBC 05/20/2016 5.10  3.80 - 5.10 MIL/uL Final  . Hemoglobin 05/20/2016 15.2* 12.0 - 15.0 g/dL Final  . HCT 05/20/2016 44.9  35.0 - 45.0 % Final  . MCV 05/20/2016 88.0  80.0  - 100.0 fL Final  . MCH 05/20/2016 29.8  27.0 - 33.0 pg Final  . MCHC 05/20/2016 33.9  32.0 - 36.0 g/dL Final  . RDW 05/20/2016 13.2  11.0 - 15.0 % Final  . Platelets 05/20/2016 232  140 - 400 K/uL Final  . MPV 05/20/2016 10.2  7.5 - 12.5 fL Final  . Neutro Abs 05/20/2016 6208  1,500 - 7,800 cells/uL Final  . Lymphs Abs 05/20/2016 2522  850 - 3,900 cells/uL Final  . Monocytes Absolute 05/20/2016 582  200 - 950 cells/uL Final  . Eosinophils Absolute 05/20/2016 291  15 - 500 cells/uL Final  . Basophils Absolute 05/20/2016 97  0 - 200 cells/uL Final  . Neutrophils Relative % 05/20/2016 64  % Final  . Lymphocytes Relative 05/20/2016 26  % Final  . Monocytes Relative 05/20/2016 6  % Final  . Eosinophils Relative 05/20/2016 3  % Final  . Basophils Relative 05/20/2016 1  % Final  . Smear Review 05/20/2016 Criteria for review not met  Final  . Sodium 05/20/2016 140  135 - 146 mmol/L Final  . Potassium 05/20/2016 4.4  3.5 - 5.3 mmol/L Final  . Chloride 05/20/2016 104  98 - 110 mmol/L Final  . CO2 05/20/2016 23  20 - 31 mmol/L Final  . Glucose, Bld 05/20/2016 128* 70 - 99 mg/dL Final  . BUN 05/20/2016 10  7 - 25 mg/dL Final  . Creat 05/20/2016 0.64  0.50 - 1.05 mg/dL Final   Comment:   For patients > or = 50 years of age: The upper reference limit for Creatinine is approximately 13% higher for people identified as African-American.     . Total Bilirubin 05/20/2016 0.6  0.2 - 1.2 mg/dL Final  . Alkaline Phosphatase 05/20/2016 70  33 - 130 U/L Final  . AST 05/20/2016 22  10 - 35 U/L Final  . ALT 05/20/2016 57* 6 - 29 U/L Final  . Total Protein 05/20/2016 7.2  6.1 - 8.1 g/dL Final  . Albumin 05/20/2016 4.2  3.6 - 5.1 g/dL Final  . Calcium 05/20/2016 9.5  8.6 - 10.4 mg/dL Final  . Hgb A1c MFr Bld 05/20/2016 6.2* <5.7 % Final   Comment:   For someone without known diabetes, a hemoglobin A1c value between 5.7% and 6.4% is consistent with prediabetes and should be confirmed with a follow-up  test.   For someone with known diabetes, a value <7% indicates that their diabetes is well controlled. A1c targets should be individualized based on duration of diabetes, age, co-morbid conditions and other considerations.   This assay result is consistent with an increased risk of diabetes.   Currently, no consensus exists regarding use of hemoglobin A1c for diagnosis of diabetes in children.     . Mean Plasma Glucose 05/20/2016 131  mg/dL Final  . Cholesterol 05/20/2016 164  <200 mg/dL Final  . Triglycerides 05/20/2016 203* <150 mg/dL Final  . HDL 05/20/2016 36* >50 mg/dL Final  . Total CHOL/HDL Ratio 05/20/2016 4.6  <5.0 Ratio Final  . VLDL 05/20/2016 41* <30 mg/dL Final  . LDL Cholesterol 05/20/2016 87  <100 mg/dL Final  Patient admits that she has been negligent with her diet and her exercise. She has gained 13 pounds. As a result her hemoglobin A1c has risen slightly from 5.9-6.2. She has still remained on Jardiance.  However she stopped the medication 2 weeks ago due to yeast infections. Therefore I anticipate that the hemoglobin A1c will likely climbed even higher possibly as high as 7.2. Her LDL cholesterol is excellent but her triglycerides are gone up to 203. Her liver function test have also increased slightly. I believe all of this is a reflection of her dietary changes and her reduction in exercise. She is here today to discuss this further Past Medical History:  Diagnosis Date  . ADD (attention deficit disorder)   . Diabetes mellitus without complication ([place])   . Hypercholesterolemia   . Hypertension   . Thyroid disorder    hypothyroidism   Past Surgical History:  Procedure Laterality Date  . BREAST SURGERY    . CERVIX SURGERY    . EYE SURGERY     Current Outpatient Prescriptions on File Prior to Visit  Medication Sig Dispense Refill  . atorvastatin (LIPITOR) 20 MG tablet TAKE 1 TABLET DAILY 90 tablet 1  . losartan (COZAAR) 50 MG tablet TAKE 1 TABLET DAILY 90  tablet 1  . metFORMIN (GLUCOPHAGE) 500 MG tablet 2 tabs po bid 120 tablet 0  . JARDIANCE 25 MG  TABS tablet TAKE 1 TABLET DAILY (Patient not taking: Reported on 05/24/2016) 90 tablet 1   No current facility-administered medications on file prior to visit.    No Known Allergies Social History   Social History  . Marital status: Married    Spouse name: N/A  . Number of children: N/A  . Years of education: N/A   Occupational History  . Not on file.   Social History Main Topics  . Smoking status: Former Research scientist (life sciences)  . Smokeless tobacco: Never Used  . Alcohol use No  . Drug use: No  . Sexual activity: Not on file     Comment: married.  2 sons.  Currently, stay at home mom.   Other Topics Concern  . Not on file   Social History Narrative  . No narrative on file      Review of Systems  All other systems reviewed and are negative.      Objective:   Physical Exam  Constitutional: She appears well-developed and well-nourished. No distress.  Neck: Neck supple. No JVD present. No thyromegaly present.  Cardiovascular: Normal rate, regular rhythm and normal heart sounds.   No murmur heard. Pulmonary/Chest: Effort normal and breath sounds normal. No respiratory distress. She has no wheezes. She has no rales.  Abdominal: Soft. Bowel sounds are normal. She exhibits no distension. There is no tenderness. There is no rebound and no guarding.  Musculoskeletal: She exhibits no edema.  Lymphadenopathy:    She has no cervical adenopathy.  Skin: She is not diaphoretic.  Vitals reviewed.         Assessment & Plan:  Type 2 diabetes mellitus without complication, without long-term current use of insulin (HCC)  Pure hypercholesterolemia  Essential hypertension  Subclinical hypothyroidism Patient has gained 13 pounds. Her blood pressures up. Her A1c is up slightly. Her triglycerides are up slightly. Her liver function tests are up slightly. I have recommended 10 pound weight loss. I am  file with the patient stopping guardian status she does not make changes in her lifestyle will recheck her A1c in 6 months it will likely be higher than 7. Therefore I will the patient exercise 30 minutes a day 5 days a week and try to take off 10 pounds. If she is unsuccessful to [employer] changes, I would recommend trying Victoza at her next visit

## 2016-05-24 NOTE — Addendum Note (Signed)
Addended by: Shary Decamp B on: 05/24/2016 05:06 PM   Modules accepted: Orders

## 2016-06-01 ENCOUNTER — Telehealth: Payer: Self-pay | Admitting: Family Medicine

## 2016-06-01 NOTE — Telephone Encounter (Signed)
Insurance will not cover Victoza but will cover Bydureon, Byetta or Trulicity. Which would you like for her to use?

## 2016-06-01 NOTE — Telephone Encounter (Signed)
trulicity (1.5 weekly).

## 2016-06-02 MED ORDER — DULAGLUTIDE 1.5 MG/0.5ML ~~LOC~~ SOAJ: 1.5000 mg | SUBCUTANEOUS | 3 refills | Status: DC

## 2016-06-02 NOTE — Telephone Encounter (Signed)
Medication called/sent to requested pharmacy and pt aware 

## 2016-07-19 ENCOUNTER — Telehealth: Payer: Self-pay | Admitting: Family Medicine

## 2016-07-19 NOTE — Telephone Encounter (Signed)
Pt states that since starting the Trulicity she is extremely nauseated and wakes up in the middle of the night vomiting. She states that you had mentioned something else for her diabetes that she would like to try, she is not sure what the name of the med was.

## 2016-07-19 NOTE — Telephone Encounter (Signed)
I would resume jardiance, victoza was other option but it would likely cause nausea also.

## 2016-07-20 NOTE — Telephone Encounter (Signed)
Patient aware of providers recommendations.  

## 2016-08-21 ENCOUNTER — Other Ambulatory Visit: Payer: Self-pay | Admitting: Family Medicine

## 2016-08-31 ENCOUNTER — Other Ambulatory Visit: Payer: Self-pay | Admitting: Family Medicine

## 2016-10-05 ENCOUNTER — Telehealth: Payer: Self-pay | Admitting: Family Medicine

## 2016-10-05 MED ORDER — METFORMIN HCL 500 MG PO TABS: 1000.0000 mg | ORAL_TABLET | Freq: Two times a day (BID) | ORAL | 0 refills | Status: DC

## 2016-10-05 NOTE — Telephone Encounter (Signed)
Med sent to [facility] as requested  

## 2016-10-05 NOTE — Telephone Encounter (Signed)
Patient says mail order is faxing for her metformin, however needs a week supply [facility] [address] [facility] possible so she does not run out  [phone]

## 2016-10-08 ENCOUNTER — Other Ambulatory Visit: Payer: Self-pay | Admitting: Family Medicine

## 2016-10-08 MED ORDER — METFORMIN HCL 500 MG PO TABS: 1000.0000 mg | ORAL_TABLET | Freq: Two times a day (BID) | ORAL | 3 refills | Status: DC

## 2016-11-01 ENCOUNTER — Other Ambulatory Visit: Payer: Self-pay | Admitting: Family Medicine

## 2016-11-22 ENCOUNTER — Ambulatory Visit: Payer: Managed Care, Other (non HMO) | Admitting: Family Medicine

## 2017-01-18 ENCOUNTER — Other Ambulatory Visit: Payer: BLUE CROSS/BLUE SHIELD

## 2017-01-18 ENCOUNTER — Other Ambulatory Visit: Payer: Self-pay | Admitting: Family Medicine

## 2017-01-18 DIAGNOSIS — E785 Hyperlipidemia, unspecified: Secondary | ICD-10-CM

## 2017-01-18 DIAGNOSIS — E1165 Type 2 diabetes mellitus with hyperglycemia: Secondary | ICD-10-CM

## 2017-01-18 DIAGNOSIS — Z79899 Other long term (current) drug therapy: Secondary | ICD-10-CM

## 2017-01-18 DIAGNOSIS — I10 Essential (primary) hypertension: Secondary | ICD-10-CM

## 2017-01-18 DIAGNOSIS — E079 Disorder of thyroid, unspecified: Secondary | ICD-10-CM

## 2017-01-19 LAB — CBC WITH DIFFERENTIAL/PLATELET
BASOS ABS: 91 {cells}/uL (ref 0–200)
Basophils Relative: 1.4 %
Eosinophils Absolute: 202 cells/uL (ref 15–500)
Eosinophils Relative: 3.1 %
HEMATOCRIT: 46.2 % — AB (ref 35.0–45.0)
Hemoglobin: 15.6 g/dL — ABNORMAL HIGH (ref 11.7–15.5)
LYMPHS ABS: 2217 {cells}/uL (ref 850–3900)
MCH: 29.8 pg (ref 27.0–33.0)
MCHC: 33.8 g/dL (ref 32.0–36.0)
MCV: 88.3 fL (ref 80.0–100.0)
MPV: 10.8 fL (ref 7.5–12.5)
Monocytes Relative: 7.7 %
NEUTROS PCT: 53.7 %
Neutro Abs: 3491 cells/uL (ref 1500–7800)
PLATELETS: 247 10*3/uL (ref 140–400)
RBC: 5.23 10*6/uL — ABNORMAL HIGH (ref 3.80–5.10)
RDW: 12.8 % (ref 11.0–15.0)
TOTAL LYMPHOCYTE: 34.1 %
WBC: 6.5 10*3/uL (ref 3.8–10.8)
WBCMIX: 501 {cells}/uL (ref 200–950)

## 2017-01-19 LAB — LIPID PANEL
Cholesterol: 269 mg/dL — ABNORMAL HIGH (ref <200)
HDL: 35 mg/dL — ABNORMAL LOW (ref >50)
LDL Cholesterol (Calc): 187 mg/dL (calc) — ABNORMAL HIGH
NON-HDL CHOLESTEROL (CALC): 234 mg/dL — AB (ref <130)
Total CHOL/HDL Ratio: 7.7 (calc) — ABNORMAL HIGH (ref <5.0)
Triglycerides: 262 mg/dL — ABNORMAL HIGH (ref <150)

## 2017-01-19 LAB — COMPLETE METABOLIC PANEL WITH GFR
AG Ratio: 1.7 (calc) (ref 1.0–2.5)
ALT: 80 U/L — AB (ref 6–29)
AST: 42 U/L — AB (ref 10–35)
Albumin: 4.5 g/dL (ref 3.6–5.1)
Alkaline phosphatase (APISO): 75 U/L (ref 33–130)
BUN: 11 mg/dL (ref 7–25)
CALCIUM: 9.6 mg/dL (ref 8.6–10.4)
CO2: 25 mmol/L (ref 20–32)
Chloride: 102 mmol/L (ref 98–110)
Creat: 0.68 mg/dL (ref 0.50–1.05)
GFR, EST NON AFRICAN AMERICAN: 101 mL/min/{1.73_m2} (ref >=60)
GFR, Est African American: 117 mL/min/{1.73_m2} (ref >=60)
GLUCOSE: 165 mg/dL — AB (ref 65–99)
Globulin: 2.7 g/dL (calc) (ref 1.9–3.7)
POTASSIUM: 4.5 mmol/L (ref 3.5–5.3)
Sodium: 138 mmol/L (ref 135–146)
Total Bilirubin: 0.4 mg/dL (ref 0.2–1.2)
Total Protein: 7.2 g/dL (ref 6.1–8.1)

## 2017-01-19 LAB — TSH: TSH: 5.58 m[IU]/L — AB

## 2017-01-19 LAB — HEMOGLOBIN A1C
EAG (MMOL/L): 8.7 (calc)
Hgb A1c MFr Bld: 7.1 % of total Hgb — ABNORMAL HIGH (ref <5.7)
Mean Plasma Glucose: 157 (calc)

## 2017-01-19 LAB — MICROALBUMIN / CREATININE URINE RATIO
Creatinine, Urine: 55 mg/dL (ref 20–275)
Microalb Creat Ratio: 7 mcg/mg creat (ref <30)
Microalb, Ur: 0.4 mg/dL

## 2017-01-20 ENCOUNTER — Ambulatory Visit (INDEPENDENT_AMBULATORY_CARE_PROVIDER_SITE_OTHER): Payer: Managed Care, Other (non HMO) | Admitting: Family Medicine

## 2017-01-20 ENCOUNTER — Encounter: Payer: Self-pay | Admitting: Family Medicine

## 2017-01-20 VITALS — BP 156/98 | HR 78 | Temp 97.8°F | Resp 18 | Ht 63.0 in | Wt 217.0 lb

## 2017-01-20 DIAGNOSIS — E038 Other specified hypothyroidism: Secondary | ICD-10-CM

## 2017-01-20 DIAGNOSIS — E7800 Pure hypercholesterolemia, unspecified: Secondary | ICD-10-CM | POA: Diagnosis not present

## 2017-01-20 DIAGNOSIS — I10 Essential (primary) hypertension: Secondary | ICD-10-CM | POA: Diagnosis not present

## 2017-01-20 DIAGNOSIS — E119 Type 2 diabetes mellitus without complications: Secondary | ICD-10-CM

## 2017-01-20 DIAGNOSIS — E039 Hypothyroidism, unspecified: Secondary | ICD-10-CM

## 2017-01-20 MED ORDER — LOSARTAN POTASSIUM 50 MG PO TABS: 50.0000 mg | ORAL_TABLET | Freq: Every day | ORAL | 1 refills | Status: DC

## 2017-01-20 MED ORDER — ATORVASTATIN CALCIUM 20 MG PO TABS: 20.0000 mg | ORAL_TABLET | Freq: Every day | ORAL | 1 refills | Status: DC

## 2017-01-20 MED ORDER — METFORMIN HCL 500 MG PO TABS: 1000.0000 mg | ORAL_TABLET | Freq: Two times a day (BID) | ORAL | 3 refills | Status: DC

## 2017-01-20 NOTE — Progress Notes (Signed)
Subjective:    Patient ID: Samantha Glover, female    DOB: 1965/05/19, 51 y.o.   MRN: [id number]  Medication Refill    05/24/16 Here for follow up.  Patient admits that she has been negligent with her diet and her exercise. She has gained 13 pounds. As a result her hemoglobin A1c has risen slightly from 5.9-6.2. She has still remained on Jardiance.  However she stopped the medication 2 weeks ago due to yeast infections. Therefore I anticipate that the hemoglobin A1c will likely climbed even higher possibly as high as 7.2. Her LDL cholesterol is excellent but her triglycerides are gone up to 203. Her liver function test have also increased slightly. I believe all of this is a reflection of her dietary changes and her reduction in exercise. She is here today to discuss this further.  At that time, my plan was: Patient has gained 13 pounds. Her blood pressures up. Her A1c is up slightly. Her triglycerides are up slightly. Her liver function tests are up slightly. I have recommended 10 pound weight loss. I am file with the patient stopping guardian status she does not make changes in her lifestyle will recheck her A1c in 6 months it will likely be higher than 7. Therefore I will the patient exercise 30 minutes a day 5 days a week and try to take off 10 pounds. If she is unsuccessful with lifestyle changes, I would recommend trying Victoza at her next visit.  01/20/17 Appointment on 01/18/2017  Component Date Value Ref Range Status  . Creatinine, Urine 01/18/2017 55  20 - 275 mg/dL Final  . Microalb, Ur 01/18/2017 0.4  mg/dL Final   Comment: Reference Range Not established   . Microalb Creat Ratio 01/18/2017 7  <30 mcg/mg creat Final   Comment: . The ADA defines abnormalities in albumin excretion as follows: Marland Kitchen Category         Result (mcg/mg creatinine) . Normal                    <30 Microalbuminuria         30-299  Clinical albuminuria   > OR = 300 . The ADA recommends that at least two of  three specimens collected within a 3-6 month period be abnormal before considering a patient to be within a diagnostic category.   . Hgb A1c MFr Bld 01/18/2017 7.1* <5.7 % of total Hgb Final   Comment: For someone without known diabetes, a hemoglobin A1c value of 6.5% or greater indicates that they may have  diabetes and this should be confirmed with a follow-up  test. . For someone with known diabetes, a value <7% indicates  that their diabetes is well controlled and a value  greater than or equal to 7% indicates suboptimal  control. A1c targets should be individualized based on  duration of diabetes, age, comorbid conditions, and  other considerations. . Currently, no consensus exists regarding use of hemoglobin A1c for diagnosis of diabetes for children. .   . Mean Plasma Glucose 01/18/2017 157  (calc) Final  . eAG (mmol/L) 01/18/2017 8.7  (calc) Final  . WBC 01/18/2017 6.5  3.8 - 10.8 Thousand/uL Final  . RBC 01/18/2017 5.23* 3.80 - 5.10 Million/uL Final  . Hemoglobin 01/18/2017 15.6* 11.7 - 15.5 g/dL Final  . HCT 01/18/2017 46.2* 35.0 - 45.0 % Final  . MCV 01/18/2017 88.3  80.0 - 100.0 fL Final  . MCH 01/18/2017 29.8  27.0 - 33.0 pg Final  .  MCHC 01/18/2017 33.8  32.0 - 36.0 g/dL Final  . RDW 01/18/2017 12.8  11.0 - 15.0 % Final  . Platelets 01/18/2017 247  140 - 400 Thousand/uL Final  . MPV 01/18/2017 10.8  7.5 - 12.5 fL Final  . Neutro Abs 01/18/2017 3491  1,500 - 7,800 cells/uL Final  . Lymphs Abs 01/18/2017 2217  850 - 3,900 cells/uL Final  . WBC mixed population 01/18/2017 501  200 - 950 cells/uL Final  . Eosinophils Absolute 01/18/2017 202  15 - 500 cells/uL Final  . Basophils Absolute 01/18/2017 91  0 - 200 cells/uL Final  . Neutrophils Relative % 01/18/2017 53.7  % Final  . Total Lymphocyte 01/18/2017 34.1  % Final  . Monocytes Relative 01/18/2017 7.7  % Final  . Eosinophils Relative 01/18/2017 3.1  % Final  . Basophils Relative 01/18/2017 1.4  % Final  .  Cholesterol 01/18/2017 269* <200 mg/dL Final  . HDL 01/18/2017 35* >50 mg/dL Final  . Triglycerides 01/18/2017 262* <150 mg/dL Final  . LDL Cholesterol (Calc) 01/18/2017 187* mg/dL (calc) Final   Comment: Reference range: <100 . Desirable range <100 mg/dL for primary prevention;   <70 mg/dL for patients with CHD or diabetic patients  with > or = 2 CHD risk factors. Marland Kitchen LDL-C is now calculated using the Martin-Hopkins  calculation, which is a validated novel method providing  better accuracy than the Friedewald equation in the  estimation of LDL-C.  Cresenciano Genre et al. Annamaria Helling. 7169;678(93): 2061-2068  (http://education.[facility].com/faq/FAQ164)   . Total CHOL/HDL Ratio 01/18/2017 7.7* <5.0 (calc) Final  . Non-HDL Cholesterol (Calc) 01/18/2017 234* <130 mg/dL (calc) Final   Comment: Non-HDL level > or = 220 is very high and may indicate  genetic familial hypercholesterolemia (FH). Clinical  assessment and measurement of blood lipid levels  should be considered for all first-degree relatives  of patients with an FH diagnosis. . For patients with diabetes plus 1 major ASCVD risk  factor, treating to a non-HDL-C goal of <100 mg/dL  (LDL-C of <70 mg/dL) is considered a therapeutic  option.   . TSH 01/18/2017 5.58* mIU/L Final   Comment:           Reference Range .           > or = 20 Years  0.40-4.50 .                Pregnancy Ranges           First trimester    0.26-2.66           Second trimester   0.55-2.73           Third trimester    0.43-2.91   . Glucose, Bld 01/18/2017 165* 65 - 99 mg/dL Final   Comment: .            Fasting reference interval . For someone without known diabetes, a glucose value >125 mg/dL indicates that they may have diabetes and this should be confirmed with a follow-up test. .   . BUN 01/18/2017 11  7 - 25 mg/dL Final  . Creat 01/18/2017 0.68  0.50 - 1.05 mg/dL Final   Comment: For patients >44 years of age, the reference limit for Creatinine  is approximately 13% higher for people identified as African-American. .   . GFR, Est Non African American 01/18/2017 101  > OR = 60 mL/min/1.32m2 Final  . GFR, Est African American 01/18/2017 117  > OR = 60 mL/min/1.24m2 Final  .  BUN/Creatinine Ratio 51/88/4166 NOT APPLICABLE  6 - 22 (calc) Final  . Sodium 01/18/2017 138  135 - 146 mmol/L Final  . Potassium 01/18/2017 4.5  3.5 - 5.3 mmol/L Final  . Chloride 01/18/2017 102  98 - 110 mmol/L Final  . CO2 01/18/2017 25  20 - 32 mmol/L Final  . Calcium 01/18/2017 9.6  8.6 - 10.4 mg/dL Final  . Total Protein 01/18/2017 7.2  6.1 - 8.1 g/dL Final  . Albumin 01/18/2017 4.5  3.6 - 5.1 g/dL Final  . Globulin 01/18/2017 2.7  1.9 - 3.7 g/dL (calc) Final  . AG Ratio 01/18/2017 1.7  1.0 - 2.5 (calc) Final  . Total Bilirubin 01/18/2017 0.4  0.2 - 1.2 mg/dL Final  . Alkaline phosphatase (APISO) 01/18/2017 75  33 - 130 U/L Final  . AST 01/18/2017 42* 10 - 35 U/L Final  . ALT 01/18/2017 80* 6 - 29 U/L Final  Patient is here for follow up of her DM type 2, HLD, hypothyroidism, and essential hypertension.  Her most recent labs are listed above and show progression of her DM2 with HgA1c rising to 7.1 up from 6.2 in January.  Her LDL cholesterol has risen substantially from 87 (1/18) to 187 off lipitor.  Her subclinical hypothyroidism remains stable but her elevated LFT's have worsened coinciding with the rise in her LDL and blood sugars.  Unfortunately, she has not been able to achieve any substantial weight loss since last visit.  About three months ago, due to insurance reasons, She stopped all of her meds.  As a result, her blood pressure is also elevated at 156/98.  She denies any chest pain, sob, doe.  She denies polyuria, polydypsia, or blurry vision.  We calculated her 10 year risk of ASCVD to be 13% with a lifetime risk of 50% at her current results.    Wt Readings from Last 3 Encounters:  01/20/17 217 lb (98.4 kg)  05/24/16 219 lb (99.3 kg)  10/17/15  206 lb (93.4 kg)    Past Medical History:  Diagnosis Date  . ADD (attention deficit disorder)   . Diabetes mellitus without complication ([place])   . Hypercholesterolemia   . Hypertension   . Thyroid disorder    hypothyroidism   Past Surgical History:  Procedure Laterality Date  . BREAST SURGERY    . CERVIX SURGERY    . EYE SURGERY     Current Outpatient Prescriptions on File Prior to Visit  Medication Sig Dispense Refill  . atorvastatin (LIPITOR) 20 MG tablet TAKE 1 TABLET DAILY 90 tablet 1  . JARDIANCE 25 MG TABS tablet TAKE 1 TABLET DAILY (Patient not taking: Reported on 05/24/2016) 90 tablet 1  . losartan (COZAAR) 50 MG tablet TAKE 1 TABLET DAILY 90 tablet 1  . metFORMIN (GLUCOPHAGE) 500 MG tablet Take 2 tablets (1,000 mg total) by mouth 2 (two) times daily. 120 tablet 3  . metFORMIN (GLUCOPHAGE) 500 MG tablet TAKE 2 TABLETS (1,000 MG TOTAL) BY MOUTH 2 (TWO) TIMES DAILY. 120 tablet 0   No current facility-administered medications on file prior to visit.    No Known Allergies Social History   Social History  . Marital status: Married    Spouse name: N/A  . Number of children: N/A  . Years of education: N/A   Occupational History  . Not on file.   Social History Main Topics  . Smoking status: Former Research scientist (life sciences)  . Smokeless tobacco: Never Used  . Alcohol use No  . Drug use:  No  . Sexual activity: Not on file     Comment: married.  2 sons.  Currently, stay at home mom.   Other Topics Concern  . Not on file   Social History Narrative  . No narrative on file      Review of Systems  All other systems reviewed and are negative.      Objective:   Physical Exam  Constitutional: She appears well-developed and well-nourished. No distress.  Neck: Neck supple. No JVD present. No thyromegaly present.  Cardiovascular: Normal rate, regular rhythm and normal heart sounds.   No murmur heard. Pulmonary/Chest: Effort normal and breath sounds normal. No respiratory distress.  She has no wheezes. She has no rales.  Abdominal: Soft. Bowel sounds are normal. She exhibits no distension. There is no tenderness. There is no rebound and no guarding.  Musculoskeletal: She exhibits no edema.  Lymphadenopathy:    She has no cervical adenopathy.  Skin: She is not diaphoretic.  Vitals reviewed.         Assessment & Plan:  Type 2 diabetes mellitus without complication, without long-term current use of insulin (HCC)  Pure hypercholesterolemia  Essential hypertension  Subclinical hypothyroidism Spent more than 35 minutes with the patient in discussion regarding her results, weight loss recommendations, and lifestyle changes.  I recommended 1500 cal a day diet, low carb diet rich in fruits and vegetables but low in starches such as bread, rice, potatoes, pasta, and sweets.  Resume losartan 50 mg a day for bp.  Resume metformin 1000 mg pobid.  Resume lipitor 20 mg poqday.  Recheck fasting labs in 4 months.  Try to lose 10-15 pounds.  Stop eating late and stop snacking and junk food.

## 2017-04-21 ENCOUNTER — Ambulatory Visit: Payer: BLUE CROSS/BLUE SHIELD | Admitting: Family Medicine

## 2017-05-23 ENCOUNTER — Other Ambulatory Visit: Payer: Self-pay | Admitting: Family Medicine

## 2017-07-19 ENCOUNTER — Other Ambulatory Visit: Payer: Self-pay | Admitting: Family Medicine

## 2017-07-22 ENCOUNTER — Other Ambulatory Visit: Payer: Self-pay | Admitting: Family Medicine

## 2017-07-22 MED ORDER — METFORMIN HCL 500 MG PO TABS: 1000.0000 mg | ORAL_TABLET | Freq: Two times a day (BID) | ORAL | 1 refills | Status: DC

## 2017-07-22 MED ORDER — EMPAGLIFLOZIN 25 MG PO TABS: 25.0000 mg | ORAL_TABLET | Freq: Every day | ORAL | 1 refills | Status: DC

## 2017-07-22 MED ORDER — ATORVASTATIN CALCIUM 20 MG PO TABS: 20.0000 mg | ORAL_TABLET | Freq: Every day | ORAL | 1 refills | Status: DC

## 2017-07-22 MED ORDER — LOSARTAN POTASSIUM 50 MG PO TABS: 50.0000 mg | ORAL_TABLET | Freq: Every day | ORAL | 1 refills | Status: DC

## 2017-08-01 ENCOUNTER — Other Ambulatory Visit: Payer: Self-pay | Admitting: Family Medicine

## 2017-08-01 MED ORDER — METFORMIN HCL 500 MG PO TABS: 1000.0000 mg | ORAL_TABLET | Freq: Two times a day (BID) | ORAL | 1 refills | Status: DC

## 2017-08-09 ENCOUNTER — Other Ambulatory Visit: Payer: Self-pay | Admitting: Family Medicine

## 2017-08-09 MED ORDER — METFORMIN HCL 500 MG PO TABS: 1000.0000 mg | ORAL_TABLET | Freq: Two times a day (BID) | ORAL | 1 refills | Status: DC

## 2017-12-23 ENCOUNTER — Telehealth: Payer: Self-pay | Admitting: Family Medicine

## 2017-12-23 ENCOUNTER — Other Ambulatory Visit: Payer: Self-pay | Admitting: Family Medicine

## 2017-12-23 MED ORDER — METFORMIN HCL 1000 MG PO TABS: 1000.0000 mg | ORAL_TABLET | Freq: Two times a day (BID) | ORAL | 1 refills | Status: DC

## 2017-12-23 MED ORDER — METFORMIN HCL 1000 MG PO TABS: 1000.0000 mg | ORAL_TABLET | Freq: Two times a day (BID) | ORAL | 0 refills | Status: DC

## 2017-12-23 MED ORDER — METFORMIN HCL 500 MG PO TABS: 1000.0000 mg | ORAL_TABLET | Freq: Two times a day (BID) | ORAL | 1 refills | Status: DC

## 2017-12-23 NOTE — Telephone Encounter (Signed)
Medication called/sent to requested pharmacy  

## 2017-12-23 NOTE — Telephone Encounter (Signed)
Patient would like a refill on metformin to be called into the mail order pharmacy  [phone]

## 2017-12-29 ENCOUNTER — Other Ambulatory Visit: Payer: Self-pay | Admitting: Family Medicine

## 2017-12-29 DIAGNOSIS — E079 Disorder of thyroid, unspecified: Secondary | ICD-10-CM

## 2017-12-29 DIAGNOSIS — E038 Other specified hypothyroidism: Secondary | ICD-10-CM

## 2017-12-29 DIAGNOSIS — E7800 Pure hypercholesterolemia, unspecified: Secondary | ICD-10-CM

## 2017-12-29 DIAGNOSIS — I10 Essential (primary) hypertension: Secondary | ICD-10-CM

## 2017-12-29 DIAGNOSIS — E119 Type 2 diabetes mellitus without complications: Secondary | ICD-10-CM

## 2017-12-29 DIAGNOSIS — E039 Hypothyroidism, unspecified: Secondary | ICD-10-CM

## 2018-01-02 ENCOUNTER — Ambulatory Visit: Payer: Managed Care, Other (non HMO) | Admitting: Family Medicine

## 2018-01-09 ENCOUNTER — Other Ambulatory Visit: Payer: PRIVATE HEALTH INSURANCE

## 2018-01-09 DIAGNOSIS — E039 Hypothyroidism, unspecified: Secondary | ICD-10-CM

## 2018-01-09 DIAGNOSIS — E079 Disorder of thyroid, unspecified: Secondary | ICD-10-CM

## 2018-01-09 DIAGNOSIS — E038 Other specified hypothyroidism: Secondary | ICD-10-CM

## 2018-01-09 DIAGNOSIS — I10 Essential (primary) hypertension: Secondary | ICD-10-CM

## 2018-01-09 DIAGNOSIS — E7800 Pure hypercholesterolemia, unspecified: Secondary | ICD-10-CM

## 2018-01-09 DIAGNOSIS — E119 Type 2 diabetes mellitus without complications: Secondary | ICD-10-CM

## 2018-01-10 LAB — CBC WITH DIFFERENTIAL/PLATELET
BASOS ABS: 50 {cells}/uL (ref 0–200)
Basophils Relative: 0.7 %
Eosinophils Absolute: 223 cells/uL (ref 15–500)
Eosinophils Relative: 3.1 %
HEMATOCRIT: 43.7 % (ref 35.0–45.0)
Hemoglobin: 14.7 g/dL (ref 11.7–15.5)
LYMPHS ABS: 2347 {cells}/uL (ref 850–3900)
MCH: 29.7 pg (ref 27.0–33.0)
MCHC: 33.6 g/dL (ref 32.0–36.0)
MCV: 88.3 fL (ref 80.0–100.0)
MPV: 11.1 fL (ref 7.5–12.5)
Monocytes Relative: 7 %
NEUTROS PCT: 56.6 %
Neutro Abs: 4075 cells/uL (ref 1500–7800)
Platelets: 222 10*3/uL (ref 140–400)
RBC: 4.95 10*6/uL (ref 3.80–5.10)
RDW: 12.3 % (ref 11.0–15.0)
Total Lymphocyte: 32.6 %
WBC: 7.2 10*3/uL (ref 3.8–10.8)
WBCMIX: 504 {cells}/uL (ref 200–950)

## 2018-01-10 LAB — HEMOGLOBIN A1C
HEMOGLOBIN A1C: 8.1 %{Hb} — AB (ref <5.7)
MEAN PLASMA GLUCOSE: 186 (calc)
eAG (mmol/L): 10.3 (calc)

## 2018-01-10 LAB — LIPID PANEL
CHOLESTEROL: 170 mg/dL (ref <200)
HDL: 36 mg/dL — AB (ref >50)
LDL Cholesterol (Calc): 93 mg/dL (calc)
Non-HDL Cholesterol (Calc): 134 mg/dL (calc) — ABNORMAL HIGH (ref <130)
Total CHOL/HDL Ratio: 4.7 (calc) (ref <5.0)
Triglycerides: 284 mg/dL — ABNORMAL HIGH (ref <150)

## 2018-01-10 LAB — COMPREHENSIVE METABOLIC PANEL
AG Ratio: 1.9 (calc) (ref 1.0–2.5)
ALT: 79 U/L — AB (ref 6–29)
AST: 61 U/L — AB (ref 10–35)
Albumin: 4.7 g/dL (ref 3.6–5.1)
Alkaline phosphatase (APISO): 88 U/L (ref 33–130)
BUN: 11 mg/dL (ref 7–25)
CO2: 28 mmol/L (ref 20–32)
Calcium: 10.3 mg/dL (ref 8.6–10.4)
Chloride: 102 mmol/L (ref 98–110)
Creat: 0.68 mg/dL (ref 0.50–1.05)
GLUCOSE: 197 mg/dL — AB (ref 65–99)
Globulin: 2.5 g/dL (calc) (ref 1.9–3.7)
Potassium: 4.6 mmol/L (ref 3.5–5.3)
SODIUM: 141 mmol/L (ref 135–146)
TOTAL PROTEIN: 7.2 g/dL (ref 6.1–8.1)
Total Bilirubin: 0.6 mg/dL (ref 0.2–1.2)

## 2018-01-10 LAB — TSH: TSH: 9.92 mIU/L — ABNORMAL HIGH

## 2018-01-12 LAB — HM PAP SMEAR

## 2018-01-13 LAB — HM MAMMOGRAPHY

## 2018-01-17 ENCOUNTER — Encounter: Payer: Self-pay | Admitting: Family Medicine

## 2018-01-17 ENCOUNTER — Ambulatory Visit: Payer: PRIVATE HEALTH INSURANCE | Admitting: Family Medicine

## 2018-01-17 VITALS — BP 140/94 | HR 80 | Temp 98.0°F | Resp 16 | Ht 63.0 in | Wt 220.0 lb

## 2018-01-17 DIAGNOSIS — E039 Hypothyroidism, unspecified: Secondary | ICD-10-CM

## 2018-01-17 DIAGNOSIS — I10 Essential (primary) hypertension: Secondary | ICD-10-CM | POA: Diagnosis not present

## 2018-01-17 DIAGNOSIS — E118 Type 2 diabetes mellitus with unspecified complications: Secondary | ICD-10-CM

## 2018-01-17 DIAGNOSIS — E1165 Type 2 diabetes mellitus with hyperglycemia: Secondary | ICD-10-CM

## 2018-01-17 DIAGNOSIS — E038 Other specified hypothyroidism: Secondary | ICD-10-CM

## 2018-01-17 DIAGNOSIS — E7800 Pure hypercholesterolemia, unspecified: Secondary | ICD-10-CM | POA: Diagnosis not present

## 2018-01-17 DIAGNOSIS — IMO0002 Reserved for concepts with insufficient information to code with codable children: Secondary | ICD-10-CM

## 2018-01-17 MED ORDER — LEVOTHYROXINE SODIUM 50 MCG PO TABS: 50.0000 ug | ORAL_TABLET | Freq: Every day | ORAL | 3 refills | Status: DC

## 2018-01-17 MED ORDER — SITAGLIPTIN PHOSPHATE 100 MG PO TABS: 100.0000 mg | ORAL_TABLET | Freq: Every day | ORAL | 3 refills | Status: DC

## 2018-01-17 NOTE — Progress Notes (Signed)
Subjective:    Patient ID: Samantha Glover, female    DOB: 1965-12-15, 52 y.o.   MRN: [id number]  Medication Refill    05/24/16 Here for follow up.  Patient admits that she has been negligent with her diet and her exercise. She has gained 13 pounds. As a result her hemoglobin A1c has risen slightly from 5.9-6.2. She has still remained on Jardiance.  However she stopped the medication 2 weeks ago due to yeast infections. Therefore I anticipate that the hemoglobin A1c will likely climbed even higher possibly as high as 7.2. Her LDL cholesterol is excellent but her triglycerides are gone up to 203. Her liver function test have also increased slightly. I believe all of this is a reflection of her dietary changes and her reduction in exercise. She is here today to discuss this further.  At that time, my plan was: Patient has gained 13 pounds. Her blood pressures up. Her A1c is up slightly. Her triglycerides are up slightly. Her liver function tests are up slightly. I have recommended 10 pound weight loss. I am file with the patient stopping guardian status she does not make changes in her lifestyle will recheck her A1c in 6 months it will likely be higher than 7. Therefore I will the patient exercise 30 minutes a day 5 days a week and try to take off 10 pounds. If she is unsuccessful with lifestyle changes, I would recommend trying Victoza at her next visit.  01/20/17 Patient is here for follow up of her DM type 2, HLD, hypothyroidism, and essential hypertension.  Her most recent labs are listed above and show progression of her DM2 with HgA1c rising to 7.1 up from 6.2 in January.  Her LDL cholesterol has risen substantially from 87 (1/18) to 187 off lipitor.  Her subclinical hypothyroidism remains stable but her elevated LFT's have worsened coinciding with the rise in her LDL and blood sugars.  Unfortunately, she has not been able to achieve any substantial weight loss since last visit.  About three months  ago, due to insurance reasons, She stopped all of her meds.  As a result, her blood pressure is also elevated at 156/98.  She denies any chest pain, sob, doe.  She denies polyuria, polydypsia, or blurry vision.  We calculated her 10 year risk of ASCVD to be 13% with a lifetime risk of 50% at her current results.   At that time, my plan was: Spent more than 35 minutes with the patient in discussion regarding her results, weight loss recommendations, and lifestyle changes.  I recommended 1500 cal a day diet, low carb diet rich in fruits and vegetables but low in starches such as bread, rice, potatoes, pasta, and sweets.  Resume losartan 50 mg a day for bp.  Resume metformin 1000 mg pobid.  Resume lipitor 20 mg poqday.  Recheck fasting labs in 4 months.  Try to lose 10-15 pounds.  Stop eating late and stop snacking and junk food.    01/17/18 Per my records, the patient is due for a flu shot, Pneumovax 23, a diabetic eye exam, diabetic foot exam, a mammogram, Pap smear, and colonoscopy.  She is here today mainly to follow-up her diabetes, hyperlipidemia, hypertension, and hypothyroidism.  Patient states that our chart is incomplete.  She recently had a Pap smear and mammogram at her gynecologist.  She had a colonoscopy 2 years ago performed at [place].  I have requested this information from her specialist.  She is due  for a diabetic eye exam.  She is also due for Pneumovax 23 and she politely declines this.  She had a flu shot a few days ago at work.  Therefore this is up-to-date.  She denies any chest pain shortness of breath or dyspnea on exertion.  She denies any polyuria polydipsia or blurry vision.  She denies any myalgias or right upper quadrant pain most recent labs are listed below: No visits with results within 1 Week(s) from this visit.  Latest known visit with results is:  Appointment on 01/09/2018  Component Date Value Ref Range Status  . TSH 01/09/2018 9.92* mIU/L Final   Comment:            Reference Range .           > or = 20 Years  0.40-4.50 .                Pregnancy Ranges           First trimester    0.26-2.66           Second trimester   0.55-2.73           Third trimester    0.43-2.91   . Cholesterol 01/09/2018 170  <200 mg/dL Final  . HDL 01/09/2018 36* >50 mg/dL Final  . Triglycerides 01/09/2018 284* <150 mg/dL Final   Comment: . If a non-fasting specimen was collected, consider repeat triglyceride testing on a fasting specimen if clinically indicated.  Yates Decamp et al. J. of Clin. Lipidol. 9562;1:308-657. .   . LDL Cholesterol (Calc) 01/09/2018 93  mg/dL (calc) Final   Comment: Reference range: <100 . Desirable range <100 mg/dL for primary prevention;   <70 mg/dL for patients with CHD or diabetic patients  with > or = 2 CHD risk factors. Marland Kitchen LDL-C is now calculated using the Martin-Hopkins  calculation, which is a validated novel method providing  better accuracy than the Friedewald equation in the  estimation of LDL-C.  Cresenciano Genre et al. Annamaria Helling. 8469;629(52): 2061-2068  (http://education.[facility].com/faq/FAQ164)   . Total CHOL/HDL Ratio 01/09/2018 4.7  <5.0 (calc) Final  . Non-HDL Cholesterol (Calc) 01/09/2018 134* <130 mg/dL (calc) Final   Comment: For patients with diabetes plus 1 major ASCVD risk  factor, treating to a non-HDL-C goal of <100 mg/dL  (LDL-C of <70 mg/dL) is considered a therapeutic  option.   . Hgb A1c MFr Bld 01/09/2018 8.1* <5.7 % of total Hgb Final   Comment: For someone without known diabetes, a hemoglobin A1c value of 6.5% or greater indicates that they may have  diabetes and this should be confirmed with a follow-up  test. . For someone with known diabetes, a value <7% indicates  that their diabetes is well controlled and a value  greater than or equal to 7% indicates suboptimal  control. A1c targets should be individualized based on  duration of diabetes, age, comorbid conditions, and  other  considerations. . Currently, no consensus exists regarding use of hemoglobin A1c for diagnosis of diabetes for children. .   . Mean Plasma Glucose 01/09/2018 186  (calc) Final  . eAG (mmol/L) 01/09/2018 10.3  (calc) Final  . Glucose, Bld 01/09/2018 197* 65 - 99 mg/dL Final   Comment: .            Fasting reference interval . For someone without known diabetes, a glucose value >125 mg/dL indicates that they may have diabetes and this should be confirmed with a follow-up test. .   Marland Kitchen  BUN 01/09/2018 11  7 - 25 mg/dL Final  . Creat 01/09/2018 0.68  0.50 - 1.05 mg/dL Final   Comment: For patients >85 years of age, the reference limit for Creatinine is approximately 13% higher for people identified as African-American. .   Havery Moros Ratio 93/81/0175 NOT APPLICABLE  6 - 22 (calc) Final  . Sodium 01/09/2018 141  135 - 146 mmol/L Final  . Potassium 01/09/2018 4.6  3.5 - 5.3 mmol/L Final  . Chloride 01/09/2018 102  98 - 110 mmol/L Final  . CO2 01/09/2018 28  20 - 32 mmol/L Final  . Calcium 01/09/2018 10.3  8.6 - 10.4 mg/dL Final  . Total Protein 01/09/2018 7.2  6.1 - 8.1 g/dL Final  . Albumin 01/09/2018 4.7  3.6 - 5.1 g/dL Final  . Globulin 01/09/2018 2.5  1.9 - 3.7 g/dL (calc) Final  . AG Ratio 01/09/2018 1.9  1.0 - 2.5 (calc) Final  . Total Bilirubin 01/09/2018 0.6  0.2 - 1.2 mg/dL Final  . Alkaline phosphatase (APISO) 01/09/2018 88  33 - 130 U/L Final  . AST 01/09/2018 61* 10 - 35 U/L Final  . ALT 01/09/2018 79* 6 - 29 U/L Final  . WBC 01/09/2018 7.2  3.8 - 10.8 Thousand/uL Final  . RBC 01/09/2018 4.95  3.80 - 5.10 Million/uL Final  . Hemoglobin 01/09/2018 14.7  11.7 - 15.5 g/dL Final  . HCT 01/09/2018 43.7  35.0 - 45.0 % Final  . MCV 01/09/2018 88.3  80.0 - 100.0 fL Final  . MCH 01/09/2018 29.7  27.0 - 33.0 pg Final  . MCHC 01/09/2018 33.6  32.0 - 36.0 g/dL Final  . RDW 01/09/2018 12.3  11.0 - 15.0 % Final  . Platelets 01/09/2018 222  140 - 400 Thousand/uL Final  . MPV  01/09/2018 11.1  7.5 - 12.5 fL Final  . Neutro Abs 01/09/2018 4,075  1,500 - 7,800 cells/uL Final  . Lymphs Abs 01/09/2018 2,347  850 - 3,900 cells/uL Final  . WBC mixed population 01/09/2018 504  200 - 950 cells/uL Final  . Eosinophils Absolute 01/09/2018 223  15 - 500 cells/uL Final  . Basophils Absolute 01/09/2018 50  0 - 200 cells/uL Final  . Neutrophils Relative % 01/09/2018 56.6  % Final  . Total Lymphocyte 01/09/2018 32.6  % Final  . Monocytes Relative 01/09/2018 7.0  % Final  . Eosinophils Relative 01/09/2018 3.1  % Final  . Basophils Relative 01/09/2018 0.7  % Final    Past Medical History:  Diagnosis Date  . ADD (attention deficit disorder)   . Diabetes mellitus without complication ([place])   . Hypercholesterolemia   . Hypertension   . Thyroid disorder    hypothyroidism   Past Surgical History:  Procedure Laterality Date  . BREAST SURGERY    . CERVIX SURGERY    . EYE SURGERY     Current Outpatient Medications on File Prior to Visit  Medication Sig Dispense Refill  . atorvastatin (LIPITOR) 20 MG tablet Take 1 tablet (20 mg total) by mouth daily. 90 tablet 1  . empagliflozin (JARDIANCE) 25 MG TABS tablet Take 25 mg by mouth daily. 90 tablet 1  . losartan (COZAAR) 50 MG tablet Take 1 tablet (50 mg total) by mouth daily. 90 tablet 1  . metFORMIN (GLUCOPHAGE) 1000 MG tablet Take 1 tablet (1,000 mg total) by mouth 2 (two) times daily with a meal. 180 tablet 1   No current facility-administered medications on file prior to visit.    No  Known Allergies Social History   Socioeconomic History  . Marital status: Married    Spouse name: Not on file  . Number of children: Not on file  . Years of education: Not on file  . Highest education level: Not on file  Occupational History  . Not on file  Social Needs  . Financial resource strain: Not on file  . Food insecurity:    Worry: Not on file    Inability: Not on file  . Transportation needs:    Medical: Not on file     Non-medical: Not on file  Tobacco Use  . Smoking status: Former Research scientist (life sciences)  . Smokeless tobacco: Never Used  Substance and Sexual Activity  . Alcohol use: No  . Drug use: No  . Sexual activity: Not on file    Comment: married.  2 sons.  Currently, stay at home mom.  Lifestyle  . Physical activity:    Days per week: Not on file    Minutes per session: Not on file  . Stress: Not on file  Relationships  . Social connections:    Talks on phone: Not on file    Gets together: Not on file    Attends religious service: Not on file    Active member of club or organization: Not on file    Attends meetings of clubs or organizations: Not on file    Relationship status: Not on file  . Intimate partner violence:    Fear of current or ex partner: Not on file    Emotionally abused: Not on file    Physically abused: Not on file    Forced sexual activity: Not on file  Other Topics Concern  . Not on file  Social History Narrative  . Not on file      Review of Systems  All other systems reviewed and are negative.      Objective:   Physical Exam  Constitutional: She appears well-developed and well-nourished. No distress.  Neck: Neck supple.  Cardiovascular: Normal rate, regular rhythm and normal heart sounds.  No murmur heard. Pulmonary/Chest: Effort normal and breath sounds normal. No respiratory distress. She has no wheezes. She has no rales.  Abdominal: Soft. Bowel sounds are normal.  Skin: She is not diaphoretic.  Vitals reviewed.         Assessment & Plan:  Diabetes mellitus type 2 with complications, uncontrolled ([place]) - Plan: Ambulatory referral to Ophthalmology, sitaGLIPtin (JANUVIA) 100 MG tablet  Pure hypercholesterolemia  Essential hypertension  Subclinical hypothyroidism  Spent 20 minutes today with the patient reviewing her labs.  I have recommended Victoza to help with weight loss and better manage her diabetes given her hemoglobin A1c of 8.1.  However the  patient had a poor experience with Trulicity and also is afraid of needles.  Therefore we have elected to add Januvia 100 mg a day to her metformin and recheck fasting lab work in 3 months.  Her LDL cholesterol is acceptable however I strongly encourage the patient to be consistent with her diet, her exercise, and her weight loss to help address her elevated triglycerides.  Also believe that by treating her subclinical hypothyroidism particular given the worsening TSH, I could perhaps help with her fatigue, her weight loss, and her triglycerides and glycemic control.  Furthermore the patient is now reporting some fatigue and hair loss/hair thinning.  Therefore I will add levothyroxine 50 mcg a day and recheck a TSH in 8 weeks.  Recommended Pneumovax 23 but  the patient declined.  I will obtain records of her colonoscopy, her Pap smear, and her mammogram which have all been done and are all up-to-date.  I have recommended a diabetic eye exam and I will consult ophthalmology for this.

## 2018-01-18 ENCOUNTER — Encounter: Payer: Self-pay | Admitting: Family Medicine

## 2018-01-18 ENCOUNTER — Other Ambulatory Visit: Payer: Self-pay | Admitting: Family Medicine

## 2018-01-19 ENCOUNTER — Other Ambulatory Visit: Payer: Self-pay | Admitting: Family Medicine

## 2018-01-19 MED ORDER — LIRAGLUTIDE 18 MG/3ML ~~LOC~~ SOPN: 1.2000 mg | PEN_INJECTOR | Freq: Every day | SUBCUTANEOUS | 3 refills | Status: DC

## 2018-01-20 MED ORDER — INSULIN PEN NEEDLE 32G X 4 MM MISC: 3 refills | Status: DC

## 2018-02-06 ENCOUNTER — Encounter: Payer: Self-pay | Admitting: *Deleted

## 2018-03-14 ENCOUNTER — Ambulatory Visit: Payer: PRIVATE HEALTH INSURANCE | Admitting: Family Medicine

## 2018-04-14 ENCOUNTER — Other Ambulatory Visit: Payer: PRIVATE HEALTH INSURANCE

## 2018-04-14 DIAGNOSIS — E038 Other specified hypothyroidism: Secondary | ICD-10-CM

## 2018-04-14 DIAGNOSIS — Z79899 Other long term (current) drug therapy: Secondary | ICD-10-CM

## 2018-04-14 DIAGNOSIS — E039 Hypothyroidism, unspecified: Secondary | ICD-10-CM

## 2018-04-14 DIAGNOSIS — E7800 Pure hypercholesterolemia, unspecified: Secondary | ICD-10-CM

## 2018-04-14 DIAGNOSIS — E1165 Type 2 diabetes mellitus with hyperglycemia: Secondary | ICD-10-CM

## 2018-04-14 DIAGNOSIS — IMO0002 Reserved for concepts with insufficient information to code with codable children: Secondary | ICD-10-CM

## 2018-04-14 DIAGNOSIS — I10 Essential (primary) hypertension: Secondary | ICD-10-CM

## 2018-04-14 DIAGNOSIS — E118 Type 2 diabetes mellitus with unspecified complications: Principal | ICD-10-CM

## 2018-04-16 LAB — COMPREHENSIVE METABOLIC PANEL
AG Ratio: 1.4 (calc) (ref 1.0–2.5)
ALBUMIN MSPROF: 4.2 g/dL (ref 3.6–5.1)
ALKALINE PHOSPHATASE (APISO): 78 U/L (ref 33–130)
ALT: 35 U/L — ABNORMAL HIGH (ref 6–29)
AST: 21 U/L (ref 10–35)
BUN: 15 mg/dL (ref 7–25)
CHLORIDE: 103 mmol/L (ref 98–110)
CO2: 26 mmol/L (ref 20–32)
CREATININE: 0.76 mg/dL (ref 0.50–1.05)
Calcium: 9.9 mg/dL (ref 8.6–10.4)
GLOBULIN: 3 g/dL (ref 1.9–3.7)
GLUCOSE: 108 mg/dL — AB (ref 65–99)
POTASSIUM: 4.3 mmol/L (ref 3.5–5.3)
SODIUM: 141 mmol/L (ref 135–146)
TOTAL PROTEIN: 7.2 g/dL (ref 6.1–8.1)
Total Bilirubin: 0.4 mg/dL (ref 0.2–1.2)

## 2018-04-16 LAB — CBC WITH DIFFERENTIAL/PLATELET
ABSOLUTE MONOCYTES: 486 {cells}/uL (ref 200–950)
Basophils Absolute: 53 cells/uL (ref 0–200)
Basophils Relative: 0.7 %
Eosinophils Absolute: 190 cells/uL (ref 15–500)
Eosinophils Relative: 2.5 %
HCT: 42.7 % (ref 35.0–45.0)
Hemoglobin: 14.3 g/dL (ref 11.7–15.5)
Lymphs Abs: 2660 cells/uL (ref 850–3900)
MCH: 29.2 pg (ref 27.0–33.0)
MCHC: 33.5 g/dL (ref 32.0–36.0)
MCV: 87.3 fL (ref 80.0–100.0)
MPV: 10.9 fL (ref 7.5–12.5)
Monocytes Relative: 6.4 %
NEUTROS PCT: 55.4 %
Neutro Abs: 4210 cells/uL (ref 1500–7800)
PLATELETS: 238 10*3/uL (ref 140–400)
RBC: 4.89 10*6/uL (ref 3.80–5.10)
RDW: 12.6 % (ref 11.0–15.0)
TOTAL LYMPHOCYTE: 35 %
WBC: 7.6 10*3/uL (ref 3.8–10.8)

## 2018-04-16 LAB — LIPID PANEL
CHOL/HDL RATIO: 3.8 (calc) (ref <5.0)
CHOLESTEROL: 149 mg/dL (ref <200)
HDL: 39 mg/dL — ABNORMAL LOW (ref >50)
LDL Cholesterol (Calc): 87 mg/dL (calc)
NON-HDL CHOLESTEROL (CALC): 110 mg/dL (ref <130)
Triglycerides: 129 mg/dL (ref <150)

## 2018-04-16 LAB — HEPATITIS C ANTIBODY
HEP C AB: NONREACTIVE
SIGNAL TO CUT-OFF: 0.02 (ref <1.00)

## 2018-04-16 LAB — TSH: TSH: 3.57 mIU/L

## 2018-04-17 ENCOUNTER — Ambulatory Visit (INDEPENDENT_AMBULATORY_CARE_PROVIDER_SITE_OTHER): Payer: PRIVATE HEALTH INSURANCE | Admitting: Family Medicine

## 2018-04-17 ENCOUNTER — Encounter: Payer: Self-pay | Admitting: Family Medicine

## 2018-04-17 VITALS — BP 130/78 | HR 94 | Temp 98.5°F | Resp 16 | Ht 63.0 in | Wt 206.0 lb

## 2018-04-17 DIAGNOSIS — E1165 Type 2 diabetes mellitus with hyperglycemia: Secondary | ICD-10-CM

## 2018-04-17 DIAGNOSIS — E038 Other specified hypothyroidism: Secondary | ICD-10-CM

## 2018-04-17 DIAGNOSIS — I10 Essential (primary) hypertension: Secondary | ICD-10-CM

## 2018-04-17 DIAGNOSIS — E039 Hypothyroidism, unspecified: Secondary | ICD-10-CM | POA: Diagnosis not present

## 2018-04-17 DIAGNOSIS — E7800 Pure hypercholesterolemia, unspecified: Secondary | ICD-10-CM | POA: Diagnosis not present

## 2018-04-17 DIAGNOSIS — E118 Type 2 diabetes mellitus with unspecified complications: Secondary | ICD-10-CM

## 2018-04-17 DIAGNOSIS — IMO0002 Reserved for concepts with insufficient information to code with codable children: Secondary | ICD-10-CM

## 2018-04-17 NOTE — Progress Notes (Signed)
Subjective:    Patient ID: Samantha Glover, female    DOB: 1965-12-15, 52 y.o.   MRN: [id number]  Medication Refill    05/24/16 Here for follow up.  Patient admits that she has been negligent with her diet and her exercise. She has gained 13 pounds. As a result her hemoglobin A1c has risen slightly from 5.9-6.2. She has still remained on Jardiance.  However she stopped the medication 2 weeks ago due to yeast infections. Therefore I anticipate that the hemoglobin A1c will likely climbed even higher possibly as high as 7.2. Her LDL cholesterol is excellent but her triglycerides are gone up to 203. Her liver function test have also increased slightly. I believe all of this is a reflection of her dietary changes and her reduction in exercise. She is here today to discuss this further.  At that time, my plan was: Patient has gained 13 pounds. Her blood pressures up. Her A1c is up slightly. Her triglycerides are up slightly. Her liver function tests are up slightly. I have recommended 10 pound weight loss. I am file with the patient stopping guardian status she does not make changes in her lifestyle will recheck her A1c in 6 months it will likely be higher than 7. Therefore I will the patient exercise 30 minutes a day 5 days a week and try to take off 10 pounds. If she is unsuccessful with lifestyle changes, I would recommend trying Victoza at her next visit.  01/20/17 Patient is here for follow up of her DM type 2, HLD, hypothyroidism, and essential hypertension.  Her most recent labs are listed above and show progression of her DM2 with HgA1c rising to 7.1 up from 6.2 in January.  Her LDL cholesterol has risen substantially from 87 (1/18) to 187 off lipitor.  Her subclinical hypothyroidism remains stable but her elevated LFT's have worsened coinciding with the rise in her LDL and blood sugars.  Unfortunately, she has not been able to achieve any substantial weight loss since last visit.  About three months  ago, due to insurance reasons, She stopped all of her meds.  As a result, her blood pressure is also elevated at 156/98.  She denies any chest pain, sob, doe.  She denies polyuria, polydypsia, or blurry vision.  We calculated her 10 year risk of ASCVD to be 13% with a lifetime risk of 50% at her current results.   At that time, my plan was: Spent more than 35 minutes with the patient in discussion regarding her results, weight loss recommendations, and lifestyle changes.  I recommended 1500 cal a day diet, low carb diet rich in fruits and vegetables but low in starches such as bread, rice, potatoes, pasta, and sweets.  Resume losartan 50 mg a day for bp.  Resume metformin 1000 mg pobid.  Resume lipitor 20 mg poqday.  Recheck fasting labs in 4 months.  Try to lose 10-15 pounds.  Stop eating late and stop snacking and junk food.    01/17/18 Per my records, the patient is due for a flu shot, Pneumovax 23, a diabetic eye exam, diabetic foot exam, a mammogram, Pap smear, and colonoscopy.  She is here today mainly to follow-up her diabetes, hyperlipidemia, hypertension, and hypothyroidism.  Patient states that our chart is incomplete.  She recently had a Pap smear and mammogram at her gynecologist.  She had a colonoscopy 2 years ago performed at [place].  I have requested this information from her specialist.  She is due  for a diabetic eye exam.  She is also due for Pneumovax 23 and she politely declines this.  She had a flu shot a few days ago at work.  Therefore this is up-to-date.  She denies any chest pain shortness of breath or dyspnea on exertion.  She denies any polyuria polydipsia or blurry vision.  She denies any myalgias or right upper quadrant pain.  At that time, my plan was: Spent 20 minutes today with the patient reviewing her labs.  I have recommended Victoza to help with weight loss and better manage her diabetes given her hemoglobin A1c of 8.1.  However the patient had a poor experience with  Trulicity and also is afraid of needles.  Therefore we have elected to add Januvia 100 mg a day to her metformin and recheck fasting lab work in 3 months.  Her LDL cholesterol is acceptable however I strongly encourage the patient to be consistent with her diet, her exercise, and her weight loss to help address her elevated triglycerides.  Also believe that by treating her subclinical hypothyroidism particular given the worsening TSH, I could perhaps help with her fatigue, her weight loss, and her triglycerides and glycemic control.  Furthermore the patient is now reporting some fatigue and hair loss/hair thinning.  Therefore I will add levothyroxine 50 mcg a day and recheck a TSH in 8 weeks.  Recommended Pneumovax 23 but the patient declined.  I will obtain records of her colonoscopy, her Pap smear, and her mammogram which have all been done and are all up-to-date.  I have recommended a diabetic eye exam and I will consult ophthalmology for this.  04/17/18 Since the addition of Victoza, the patient has lost about 14 pounds!. She has changed her diet.  Her fasting blood sugar has fallen almost 90 points.  Her triglycerides have fallen over 100 points.  Her HDL cholesterol has risen from 36-39.  Her total cholesterol has dropped about 30 points.  Her liver function test have improved dramatically and are almost back to normal.  Her blood pressure is better.  She is feeling better.  She is becoming more active.  She is drastically reduced her diet.  Her hemoglobin A1c is not yet back however I suspect that it will be excellent.  She denies any chest pain shortness of breath or dyspnea on exertion.  She has more energy on the levothyroxine and her TSH has normalized.  She denies any myalgias or right upper quadrant pain.  She denies any nausea or vomiting.  She denies any polyuria, polydipsia, or blurry vision Lab on 04/14/2018  Component Date Value Ref Range Status  . WBC 04/14/2018 7.6  3.8 - 10.8 Thousand/uL  Final  . RBC 04/14/2018 4.89  3.80 - 5.10 Million/uL Final  . Hemoglobin 04/14/2018 14.3  11.7 - 15.5 g/dL Final  . HCT 04/14/2018 42.7  35.0 - 45.0 % Final  . MCV 04/14/2018 87.3  80.0 - 100.0 fL Final  . MCH 04/14/2018 29.2  27.0 - 33.0 pg Final  . MCHC 04/14/2018 33.5  32.0 - 36.0 g/dL Final  . RDW 04/14/2018 12.6  11.0 - 15.0 % Final  . Platelets 04/14/2018 238  140 - 400 Thousand/uL Final  . MPV 04/14/2018 10.9  7.5 - 12.5 fL Final  . Neutro Abs 04/14/2018 4,210  1,500 - 7,800 cells/uL Final  . Lymphs Abs 04/14/2018 2,660  850 - 3,900 cells/uL Final  . Absolute Monocytes 04/14/2018 486  200 - 950 cells/uL Final  .  Eosinophils Absolute 04/14/2018 190  15 - 500 cells/uL Final  . Basophils Absolute 04/14/2018 53  0 - 200 cells/uL Final  . Neutrophils Relative % 04/14/2018 55.4  % Final  . Total Lymphocyte 04/14/2018 35.0  % Final  . Monocytes Relative 04/14/2018 6.4  % Final  . Eosinophils Relative 04/14/2018 2.5  % Final  . Basophils Relative 04/14/2018 0.7  % Final  . Glucose, Bld 04/14/2018 108* 65 - 99 mg/dL Final   Comment: .            Fasting reference interval . For someone without known diabetes, a glucose value between 100 and 125 mg/dL is consistent with prediabetes and should be confirmed with a follow-up test. .   . BUN 04/14/2018 15  7 - 25 mg/dL Final  . Creat 04/14/2018 0.76  0.50 - 1.05 mg/dL Final   Comment: For patients >91 years of age, the reference limit for Creatinine is approximately 13% higher for people identified as African-American. .   Havery Moros Ratio 62/70/3500 NOT APPLICABLE  6 - 22 (calc) Final  . Sodium 04/14/2018 141  135 - 146 mmol/L Final  . Potassium 04/14/2018 4.3  3.5 - 5.3 mmol/L Final  . Chloride 04/14/2018 103  98 - 110 mmol/L Final  . CO2 04/14/2018 26  20 - 32 mmol/L Final  . Calcium 04/14/2018 9.9  8.6 - 10.4 mg/dL Final  . Total Protein 04/14/2018 7.2  6.1 - 8.1 g/dL Final  . Albumin 04/14/2018 4.2  3.6 - 5.1 g/dL  Final  . Globulin 04/14/2018 3.0  1.9 - 3.7 g/dL (calc) Final  . AG Ratio 04/14/2018 1.4  1.0 - 2.5 (calc) Final  . Total Bilirubin 04/14/2018 0.4  0.2 - 1.2 mg/dL Final  . Alkaline phosphatase (APISO) 04/14/2018 78  33 - 130 U/L Final  . AST 04/14/2018 21  10 - 35 U/L Final  . ALT 04/14/2018 35* 6 - 29 U/L Final  . Hepatitis C Ab 04/14/2018 NON-REACTIVE  NON-REACTI Final  . SIGNAL TO CUT-OFF 04/14/2018 0.02  <1.00 Final   Comment: . HCV antibody was non-reactive. There is no laboratory  evidence of HCV infection. . In most cases, no further action is required. However, if recent HCV exposure is suspected, a test for HCV RNA (test code [id number]) is suggested. . For additional information please refer to http://education.[facility].com/faq/FAQ22v1 (This link is being provided for informational/ educational purposes only.) .   Marland Kitchen Cholesterol 04/14/2018 149  <200 mg/dL Final  . HDL 04/14/2018 39* >50 mg/dL Final  . Triglycerides 04/14/2018 129  <150 mg/dL Final  . LDL Cholesterol (Calc) 04/14/2018 87  mg/dL (calc) Final   Comment: Reference range: <100 . Desirable range <100 mg/dL for primary prevention;   <70 mg/dL for patients with CHD or diabetic patients  with > or = 2 CHD risk factors. Marland Kitchen LDL-C is now calculated using the Martin-Hopkins  calculation, which is a validated novel method providing  better accuracy than the Friedewald equation in the  estimation of LDL-C.  Cresenciano Genre et al. Annamaria Helling. 2993;716(96): 2061-2068  (http://education.[facility].com/faq/FAQ164)   . Total CHOL/HDL Ratio 04/14/2018 3.8  <5.0 (calc) Final  . Non-HDL Cholesterol (Calc) 04/14/2018 110  <130 mg/dL (calc) Final   Comment: For patients with diabetes plus 1 major ASCVD risk  factor, treating to a non-HDL-C goal of <100 mg/dL  (LDL-C of <70 mg/dL) is considered a therapeutic  option.   Marland Kitchen TSH 04/14/2018 3.57  mIU/L Final   Comment:  Reference Range .           > or = 20 Years   0.40-4.50 .                Pregnancy Ranges           First trimester    0.26-2.66           Second trimester   0.55-2.73           Third trimester    0.43-2.91     Past Medical History:  Diagnosis Date  . ADD (attention deficit disorder)   . Diabetes mellitus without complication ([place])   . Hypercholesterolemia   . Hypertension   . Thyroid disorder    hypothyroidism   Past Surgical History:  Procedure Laterality Date  . BREAST SURGERY    . CERVIX SURGERY    . EYE SURGERY     Current Outpatient Medications on File Prior to Visit  Medication Sig Dispense Refill  . atorvastatin (LIPITOR) 20 MG tablet TAKE 1 TABLET BY MOUTH  DAILY 90 tablet 1  . Insulin Pen Needle 32G X 4 MM MISC Use daily with Victoza 100 each 3  . levothyroxine (SYNTHROID, LEVOTHROID) 50 MCG tablet Take 1 tablet (50 mcg total) by mouth daily. 90 tablet 3  . liraglutide (VICTOZA) 18 MG/3ML SOPN Inject 0.2 mLs (1.2 mg total) into the skin daily. 3 pen 3  . losartan (COZAAR) 50 MG tablet TAKE 1 TABLET BY MOUTH  DAILY 90 tablet 3  . metFORMIN (GLUCOPHAGE) 1000 MG tablet Take 1 tablet (1,000 mg total) by mouth 2 (two) times daily with a meal. 180 tablet 1  . metFORMIN (GLUCOPHAGE) 1000 MG tablet TAKE 1 TABLET BY MOUTH TWICE A DAY WITH MEALS 180 tablet 1   No current facility-administered medications on file prior to visit.    No Known Allergies Social History   Socioeconomic History  . Marital status: Married    Spouse name: Not on file  . Number of children: Not on file  . Years of education: Not on file  . Highest education level: Not on file  Occupational History  . Not on file  Social Needs  . Financial resource strain: Not on file  . Food insecurity:    Worry: Not on file    Inability: Not on file  . Transportation needs:    Medical: Not on file    Non-medical: Not on file  Tobacco Use  . Smoking status: Former Research scientist (life sciences)  . Smokeless tobacco: Never Used  Substance and Sexual Activity  . Alcohol  use: No  . Drug use: No  . Sexual activity: Not on file    Comment: married.  2 sons.  Currently, stay at home mom.  Lifestyle  . Physical activity:    Days per week: Not on file    Minutes per session: Not on file  . Stress: Not on file  Relationships  . Social connections:    Talks on phone: Not on file    Gets together: Not on file    Attends religious service: Not on file    Active member of club or organization: Not on file    Attends meetings of clubs or organizations: Not on file    Relationship status: Not on file  . Intimate partner violence:    Fear of current or ex partner: Not on file    Emotionally abused: Not on file    Physically abused: Not on file  Forced sexual activity: Not on file  Other Topics Concern  . Not on file  Social History Narrative  . Not on file      Review of Systems  All other systems reviewed and are negative.      Objective:   Physical Exam  Constitutional: She appears well-developed and well-nourished. No distress.  Neck: Neck supple.  Cardiovascular: Normal rate, regular rhythm and normal heart sounds.  No murmur heard. Pulmonary/Chest: Effort normal and breath sounds normal. No respiratory distress. She has no wheezes. She has no rales.  Abdominal: Soft. Bowel sounds are normal.  Skin: She is not diaphoretic.  Vitals reviewed.         Assessment & Plan:  Diabetes mellitus type 2 with complications, uncontrolled ([place])  Pure hypercholesterolemia  Essential hypertension  Subclinical hypothyroidism  Diabetic foot exam is performed today and is normal.  Fasting blood sugar is outstanding.  I suspect that her hemoglobin A1c should reflect that.  Her blood pressure is excellent.  Her LDL cholesterol is at goal.  Her triglycerides are much better.  HDL has improved.  She has lost 14 pounds.  Inflammation of the liver has improved.  I continue to encourage 30 minutes of aerobic exercise every day and additional weight loss.   Recheck in 3 to 6 months

## 2018-04-27 ENCOUNTER — Other Ambulatory Visit: Payer: Self-pay | Admitting: Family Medicine

## 2018-05-03 ENCOUNTER — Encounter: Payer: Self-pay | Admitting: Family Medicine

## 2018-06-08 ENCOUNTER — Encounter: Payer: Self-pay | Admitting: Family Medicine

## 2018-06-19 ENCOUNTER — Telehealth: Payer: Self-pay | Admitting: Family Medicine

## 2018-06-19 NOTE — Telephone Encounter (Signed)
Patient called in today stating that she has what she believes is shingles to her forehead. Patient states that symptoms started on Saturday. Rash was about the size of a dime and now it has grown to about the size of a quarter. Patient was given an appointment for tomorrow by the front desk before being transferred to me. I advised patient that she can do they hydrocortisone cream for itching and calamine lotion and advised her to do cool compresses to help with some of the pain. Advised if it gets worse today or seems to spread to her eyes that she will need to be seen at Urgent Care today. Patient verbalized understanding.

## 2018-06-20 ENCOUNTER — Ambulatory Visit: Payer: PRIVATE HEALTH INSURANCE | Admitting: Family Medicine

## 2018-06-20 ENCOUNTER — Encounter: Payer: Self-pay | Admitting: Family Medicine

## 2018-06-20 VITALS — BP 146/88 | HR 90 | Temp 98.1°F | Resp 16 | Ht 63.0 in | Wt 209.0 lb

## 2018-06-20 DIAGNOSIS — B028 Zoster with other complications: Secondary | ICD-10-CM

## 2018-06-20 MED ORDER — HYDROCODONE-ACETAMINOPHEN 5-325 MG PO TABS: 1.0000 | ORAL_TABLET | Freq: Four times a day (QID) | ORAL | 0 refills | Status: DC | PRN

## 2018-06-20 MED ORDER — VALACYCLOVIR HCL 1 G PO TABS: 1000.0000 mg | ORAL_TABLET | Freq: Two times a day (BID) | ORAL | 0 refills | Status: DC

## 2018-06-20 NOTE — Progress Notes (Signed)
Subjective:    Patient ID: Samantha Glover, female    DOB: 09-20-1965, 53 y.o.   MRN: [id number]  Saturday into Sunday, the patient developed a painful blistering rash on the right side of her forehead tracking onto her scalp and her hair on the right temple.  The rash consist of small coalescent vesicles on an erythematous base clustered together in a linear dermatomal pattern consistent with shingles  Past Medical History:  Diagnosis Date  . ADD (attention deficit disorder)   . Diabetes mellitus without complication ([place])   . Hypercholesterolemia   . Hypertension   . Thyroid disorder    hypothyroidism   Past Surgical History:  Procedure Laterality Date  . BREAST SURGERY    . CERVIX SURGERY    . EYE SURGERY     Current Outpatient Medications on File Prior to Visit  Medication Sig Dispense Refill  . atorvastatin (LIPITOR) 20 MG tablet TAKE 1 TABLET BY MOUTH  DAILY 90 tablet 1  . Insulin Pen Needle 32G X 4 MM MISC Use daily with Victoza 100 each 3  . levothyroxine (SYNTHROID, LEVOTHROID) 50 MCG tablet Take 1 tablet (50 mcg total) by mouth daily. 90 tablet 3  . liraglutide (VICTOZA) 18 MG/3ML SOPN Inject 0.2 mLs (1.2 mg total) into the skin daily. 3 pen 3  . losartan (COZAAR) 50 MG tablet TAKE 1 TABLET BY MOUTH  DAILY 90 tablet 3  . metFORMIN (GLUCOPHAGE) 1000 MG tablet TAKE 1 TABLET BY MOUTH TWO  TIMES DAILY WITH MEALS 180 tablet 1   No current facility-administered medications on file prior to visit.    No Known Allergies Social History   Socioeconomic History  . Marital status: Married    Spouse name: Not on file  . Number of children: Not on file  . Years of education: Not on file  . Highest education level: Not on file  Occupational History  . Not on file  Social Needs  . Financial resource strain: Not on file  . Food insecurity:    Worry: Not on file    Inability: Not on file  . Transportation needs:    Medical: Not on file    Non-medical: Not on file  Tobacco  Use  . Smoking status: Former Research scientist (life sciences)  . Smokeless tobacco: Never Used  Substance and Sexual Activity  . Alcohol use: No  . Drug use: No  . Sexual activity: Not on file    Comment: married.  2 sons.  Currently, stay at home mom.  Lifestyle  . Physical activity:    Days per week: Not on file    Minutes per session: Not on file  . Stress: Not on file  Relationships  . Social connections:    Talks on phone: Not on file    Gets together: Not on file    Attends religious service: Not on file    Active member of club or organization: Not on file    Attends meetings of clubs or organizations: Not on file    Relationship status: Not on file  . Intimate partner violence:    Fear of current or ex partner: Not on file    Emotionally abused: Not on file    Physically abused: Not on file    Forced sexual activity: Not on file  Other Topics Concern  . Not on file  Social History Narrative  . Not on file      Review of Systems  All other systems reviewed and are  negative.      Objective:   Physical Exam  Constitutional: She appears well-developed and well-nourished. No distress.  HENT:  Head:    Neck: Neck supple.  Cardiovascular: Normal rate, regular rhythm and normal heart sounds.  No murmur heard. Pulmonary/Chest: Effort normal and breath sounds normal. No respiratory distress. She has no wheezes. She has no rales.  Abdominal: Soft. Bowel sounds are normal.  Skin: Rash noted. Rash is vesicular. She is not diaphoretic.  Vitals reviewed.         Assessment & Plan:  Shingles  Valtrex 1 g p.o. 3 times daily for 7 days.  Use Norco 5/325 1 p.o. every 6 hours as needed pain.  If the patient develops any blurry vision or eye pain, we will need to see ophthalmologist immediately.  There is no ocular involvement today

## 2018-07-26 ENCOUNTER — Other Ambulatory Visit: Payer: Self-pay | Admitting: Family Medicine

## 2018-11-08 ENCOUNTER — Other Ambulatory Visit: Payer: Self-pay | Admitting: Family Medicine

## 2018-11-13 ENCOUNTER — Encounter: Payer: Self-pay | Admitting: Family Medicine

## 2018-11-13 MED ORDER — LEVOTHYROXINE SODIUM 50 MCG PO TABS: 50.0000 ug | ORAL_TABLET | Freq: Every day | ORAL | 3 refills | Status: DC

## 2018-11-21 ENCOUNTER — Other Ambulatory Visit: Payer: Self-pay | Admitting: Family Medicine

## 2019-01-23 LAB — RESULTS CONSOLE HPV: CHL HPV: NEGATIVE

## 2019-01-23 LAB — HM PAP SMEAR

## 2019-01-24 LAB — RESULTS CONSOLE HPV: CHL HPV: NEGATIVE

## 2019-01-24 LAB — HM PAP SMEAR

## 2019-01-29 ENCOUNTER — Other Ambulatory Visit: Payer: PRIVATE HEALTH INSURANCE

## 2019-02-05 ENCOUNTER — Ambulatory Visit (INDEPENDENT_AMBULATORY_CARE_PROVIDER_SITE_OTHER): Payer: PRIVATE HEALTH INSURANCE

## 2019-02-05 ENCOUNTER — Other Ambulatory Visit: Payer: Self-pay

## 2019-02-05 ENCOUNTER — Other Ambulatory Visit: Payer: PRIVATE HEALTH INSURANCE

## 2019-02-05 ENCOUNTER — Other Ambulatory Visit: Payer: Self-pay | Admitting: Family Medicine

## 2019-02-05 DIAGNOSIS — Z23 Encounter for immunization: Secondary | ICD-10-CM | POA: Diagnosis not present

## 2019-02-05 DIAGNOSIS — E7800 Pure hypercholesterolemia, unspecified: Secondary | ICD-10-CM

## 2019-02-05 DIAGNOSIS — IMO0002 Reserved for concepts with insufficient information to code with codable children: Secondary | ICD-10-CM

## 2019-02-05 DIAGNOSIS — E1165 Type 2 diabetes mellitus with hyperglycemia: Secondary | ICD-10-CM

## 2019-02-05 DIAGNOSIS — E119 Type 2 diabetes mellitus without complications: Secondary | ICD-10-CM

## 2019-02-05 DIAGNOSIS — I10 Essential (primary) hypertension: Secondary | ICD-10-CM

## 2019-02-05 DIAGNOSIS — E038 Other specified hypothyroidism: Secondary | ICD-10-CM

## 2019-02-05 DIAGNOSIS — E039 Hypothyroidism, unspecified: Secondary | ICD-10-CM

## 2019-02-06 LAB — COMPREHENSIVE METABOLIC PANEL
AG Ratio: 1.5 (calc) (ref 1.0–2.5)
ALT: 83 U/L — ABNORMAL HIGH (ref 6–29)
AST: 51 U/L — ABNORMAL HIGH (ref 10–35)
Albumin: 4.3 g/dL (ref 3.6–5.1)
Alkaline phosphatase (APISO): 85 U/L (ref 37–153)
BUN: 10 mg/dL (ref 7–25)
CO2: 25 mmol/L (ref 20–32)
Calcium: 10.1 mg/dL (ref 8.6–10.4)
Chloride: 101 mmol/L (ref 98–110)
Creat: 0.81 mg/dL (ref 0.50–1.05)
Globulin: 2.9 g/dL (calc) (ref 1.9–3.7)
Glucose, Bld: 186 mg/dL — ABNORMAL HIGH (ref 65–99)
Potassium: 4.5 mmol/L (ref 3.5–5.3)
Sodium: 138 mmol/L (ref 135–146)
Total Bilirubin: 0.4 mg/dL (ref 0.2–1.2)
Total Protein: 7.2 g/dL (ref 6.1–8.1)

## 2019-02-06 LAB — CBC WITH DIFFERENTIAL/PLATELET
Absolute Monocytes: 441 cells/uL (ref 200–950)
Basophils Absolute: 68 cells/uL (ref 0–200)
Basophils Relative: 0.9 %
Eosinophils Absolute: 198 cells/uL (ref 15–500)
Eosinophils Relative: 2.6 %
HCT: 42.1 % (ref 35.0–45.0)
Hemoglobin: 14.3 g/dL (ref 11.7–15.5)
Lymphs Abs: 2690 cells/uL (ref 850–3900)
MCH: 30 pg (ref 27.0–33.0)
MCHC: 34 g/dL (ref 32.0–36.0)
MCV: 88.4 fL (ref 80.0–100.0)
MPV: 11.2 fL (ref 7.5–12.5)
Monocytes Relative: 5.8 %
Neutro Abs: 4203 cells/uL (ref 1500–7800)
Neutrophils Relative %: 55.3 %
Platelets: 228 10*3/uL (ref 140–400)
RBC: 4.76 10*6/uL (ref 3.80–5.10)
RDW: 12.1 % (ref 11.0–15.0)
Total Lymphocyte: 35.4 %
WBC: 7.6 10*3/uL (ref 3.8–10.8)

## 2019-02-06 LAB — TSH: TSH: 6.62 mIU/L — ABNORMAL HIGH

## 2019-02-06 LAB — HEMOGLOBIN A1C
Hgb A1c MFr Bld: 7.1 % of total Hgb — ABNORMAL HIGH (ref <5.7)
Mean Plasma Glucose: 157 (calc)
eAG (mmol/L): 8.7 (calc)

## 2019-02-06 LAB — LIPID PANEL
Cholesterol: 151 mg/dL (ref <200)
HDL: 36 mg/dL — ABNORMAL LOW (ref >=50)
LDL Cholesterol (Calc): 81 mg/dL (calc)
Non-HDL Cholesterol (Calc): 115 mg/dL (calc) (ref <130)
Total CHOL/HDL Ratio: 4.2 (calc) (ref <5.0)
Triglycerides: 243 mg/dL — ABNORMAL HIGH (ref <150)

## 2019-02-08 ENCOUNTER — Ambulatory Visit (INDEPENDENT_AMBULATORY_CARE_PROVIDER_SITE_OTHER): Payer: PRIVATE HEALTH INSURANCE | Admitting: Family Medicine

## 2019-02-08 ENCOUNTER — Other Ambulatory Visit: Payer: Self-pay

## 2019-02-08 VITALS — BP 130/82 | HR 91 | Temp 97.8°F | Resp 18 | Ht 63.0 in | Wt 213.0 lb

## 2019-02-08 DIAGNOSIS — E118 Type 2 diabetes mellitus with unspecified complications: Secondary | ICD-10-CM

## 2019-02-08 DIAGNOSIS — Z23 Encounter for immunization: Secondary | ICD-10-CM

## 2019-02-08 DIAGNOSIS — E7800 Pure hypercholesterolemia, unspecified: Secondary | ICD-10-CM

## 2019-02-08 DIAGNOSIS — IMO0002 Reserved for concepts with insufficient information to code with codable children: Secondary | ICD-10-CM

## 2019-02-08 DIAGNOSIS — E1165 Type 2 diabetes mellitus with hyperglycemia: Secondary | ICD-10-CM | POA: Diagnosis not present

## 2019-02-08 DIAGNOSIS — I10 Essential (primary) hypertension: Secondary | ICD-10-CM | POA: Diagnosis not present

## 2019-02-08 DIAGNOSIS — E038 Other specified hypothyroidism: Secondary | ICD-10-CM

## 2019-02-08 DIAGNOSIS — E039 Hypothyroidism, unspecified: Secondary | ICD-10-CM

## 2019-02-08 MED ORDER — LEVOTHYROXINE SODIUM 75 MCG PO TABS: 75.0000 ug | ORAL_TABLET | Freq: Every day | ORAL | 3 refills | Status: DC

## 2019-02-08 NOTE — Progress Notes (Signed)
Subjective:    Patient ID: Samantha Glover, female    DOB: 1965/05/01, 53 y.o.   MRN: [id number]  HPI Patient is here today for a follow-up of her chronic medical problems.  She has a history of diabetes mellitus type 2.  She is due today for Pneumovax 23, diabetic foot exam, and a diabetic eye exam.  She also has hypertension, hyperlipidemia, and hypothyroidism.  Her most recent lab work is included below.  TSH is slightly elevated at greater than 6 despite the fact she is on levothyroxine 50 mcg daily.  Patient admits that during the COVID-19 pandemic, she has been visiting her mother every night and sitting on the porch talking to her because she is lonely.  However her mother gives her a bowl of ice cream every night.  This could explain the rise in her triglycerides from 129 to over 250.  Her HDL cholesterol has also dropped from 39-36 however the patient states that she is working at home and has not exercised with any regularity.  She also admits to stress eating and she has been eating more junk food and cakes and cookies which also likely explains elevations in her liver function test which are due to fatty liver disease.  Her blood pressure here today is well controlled at 130/82.  She denies any chest pain shortness of breath or dyspnea on exertion.  Her gynecologist performs her breast exams and her Pap smears which are up-to-date. Past Medical History:  Diagnosis Date  . ADD (attention deficit disorder)   . Diabetes mellitus without complication ([place])   . Hypercholesterolemia   . Hypertension   . Thyroid disorder    hypothyroidism   Past Surgical History:  Procedure Laterality Date  . BREAST SURGERY    . CERVIX SURGERY    . EYE SURGERY     Current Outpatient Medications on File Prior to Visit  Medication Sig Dispense Refill  . atorvastatin (LIPITOR) 20 MG tablet TAKE 1 TABLET BY MOUTH  DAILY 90 tablet 3  . HYDROcodone-acetaminophen (NORCO) 5-325 MG tablet Take 1 tablet by mouth  every 6 (six) hours as needed for moderate pain. 30 tablet 0  . Insulin Pen Needle 32G X 4 MM MISC Use daily with Victoza 100 each 3  . levothyroxine (SYNTHROID) 50 MCG tablet Take 1 tablet (50 mcg total) by mouth daily. 90 tablet 3  . losartan (COZAAR) 50 MG tablet TAKE 1 TABLET BY MOUTH  DAILY 90 tablet 3  . metFORMIN (GLUCOPHAGE) 1000 MG tablet TAKE 1 TABLET BY MOUTH TWO  TIMES DAILY WITH MEALS 180 tablet 1  . valACYclovir (VALTREX) 1000 MG tablet Take 1 tablet (1,000 mg total) by mouth 2 (two) times daily. 21 tablet 0  . VICTOZA 18 MG/3ML SOPN INJECT 1.2 MG UNDER THE SKIN ONCE DAILY 6 pen 2   No current facility-administered medications on file prior to visit.    No Known Allergies Social History   Socioeconomic History  . Marital status: Married    Spouse name: Not on file  . Number of children: Not on file  . Years of education: Not on file  . Highest education level: Not on file  Occupational History  . Not on file  Social Needs  . Financial resource strain: Not on file  . Food insecurity    Worry: Not on file    Inability: Not on file  . Transportation needs    Medical: Not on file    Non-medical: Not on  file  Tobacco Use  . Smoking status: Former Research scientist (life sciences)  . Smokeless tobacco: Never Used  Substance and Sexual Activity  . Alcohol use: No  . Drug use: No  . Sexual activity: Not on file    Comment: married.  2 sons.  Currently, stay at home mom.  Lifestyle  . Physical activity    Days per week: Not on file    Minutes per session: Not on file  . Stress: Not on file  Relationships  . Social Herbalist on phone: Not on file    Gets together: Not on file    Attends religious service: Not on file    Active member of club or organization: Not on file    Attends meetings of clubs or organizations: Not on file    Relationship status: Not on file  . Intimate partner violence    Fear of current or ex partner: Not on file    Emotionally abused: Not on file     Physically abused: Not on file    Forced sexual activity: Not on file  Other Topics Concern  . Not on file  Social History Narrative  . Not on file      Review of Systems  All other systems reviewed and are negative.      Objective:   Physical Exam Vitals signs reviewed.  Constitutional:      General: She is not in acute distress.    Appearance: Normal appearance. She is obese. She is not ill-appearing or toxic-appearing.  Cardiovascular:     Rate and Rhythm: Normal rate and regular rhythm.     Pulses: Normal pulses.     Heart sounds: Normal heart sounds. No murmur. No friction rub. No gallop.   Pulmonary:     Effort: Pulmonary effort is normal. No respiratory distress.     Breath sounds: Normal breath sounds. No stridor. No wheezing, rhonchi or rales.  Chest:     Chest wall: No tenderness.  Abdominal:     General: Abdomen is flat. There is no distension.     Palpations: Abdomen is soft. There is no mass.     Tenderness: There is no abdominal tenderness. There is no guarding or rebound.  Musculoskeletal:     Right lower leg: No edema.     Left lower leg: No edema.  Neurological:     Mental Status: She is alert.    Appointment on 02/05/2019  Component Date Value Ref Range Status  . WBC 02/05/2019 7.6  3.8 - 10.8 Thousand/uL Final  . RBC 02/05/2019 4.76  3.80 - 5.10 Million/uL Final  . Hemoglobin 02/05/2019 14.3  11.7 - 15.5 g/dL Final  . HCT 02/05/2019 42.1  35.0 - 45.0 % Final  . MCV 02/05/2019 88.4  80.0 - 100.0 fL Final  . MCH 02/05/2019 30.0  27.0 - 33.0 pg Final  . MCHC 02/05/2019 34.0  32.0 - 36.0 g/dL Final  . RDW 02/05/2019 12.1  11.0 - 15.0 % Final  . Platelets 02/05/2019 228  140 - 400 Thousand/uL Final  . MPV 02/05/2019 11.2  7.5 - 12.5 fL Final  . Neutro Abs 02/05/2019 4,203  1,500 - 7,800 cells/uL Final  . Lymphs Abs 02/05/2019 2,690  850 - 3,900 cells/uL Final  . Absolute Monocytes 02/05/2019 441  200 - 950 cells/uL Final  . Eosinophils Absolute  02/05/2019 198  15 - 500 cells/uL Final  . Basophils Absolute 02/05/2019 68  0 - 200 cells/uL Final  .  Neutrophils Relative % 02/05/2019 55.3  % Final  . Total Lymphocyte 02/05/2019 35.4  % Final  . Monocytes Relative 02/05/2019 5.8  % Final  . Eosinophils Relative 02/05/2019 2.6  % Final  . Basophils Relative 02/05/2019 0.9  % Final  . Hgb A1c MFr Bld 02/05/2019 7.1* <5.7 % of total Hgb Final   Comment: For someone without known diabetes, a hemoglobin A1c value of 6.5% or greater indicates that they may have  diabetes and this should be confirmed with a follow-up  test. . For someone with known diabetes, a value <7% indicates  that their diabetes is well controlled and a value  greater than or equal to 7% indicates suboptimal  control. A1c targets should be individualized based on  duration of diabetes, age, comorbid conditions, and  other considerations. . Currently, no consensus exists regarding use of hemoglobin A1c for diagnosis of diabetes for children. .   . Mean Plasma Glucose 02/05/2019 157  (calc) Final  . eAG (mmol/L) 02/05/2019 8.7  (calc) Final  . Glucose, Bld 02/05/2019 186* 65 - 99 mg/dL Final   Comment: .            Fasting reference interval . For someone without known diabetes, a glucose value >125 mg/dL indicates that they may have diabetes and this should be confirmed with a follow-up test. .   . BUN 02/05/2019 10  7 - 25 mg/dL Final  . Creat 02/05/2019 0.81  0.50 - 1.05 mg/dL Final   Comment: For patients >71 years of age, the reference limit for Creatinine is approximately 13% higher for people identified as African-American. .   Havery Moros Ratio [id number] NOT APPLICABLE  6 - 22 (calc) Final  . Sodium 02/05/2019 138  135 - 146 mmol/L Final  . Potassium 02/05/2019 4.5  3.5 - 5.3 mmol/L Final  . Chloride 02/05/2019 101  98 - 110 mmol/L Final  . CO2 02/05/2019 25  20 - 32 mmol/L Final  . Calcium 02/05/2019 10.1  8.6 - 10.4 mg/dL Final  .  Total Protein 02/05/2019 7.2  6.1 - 8.1 g/dL Final  . Albumin 02/05/2019 4.3  3.6 - 5.1 g/dL Final  . Globulin 02/05/2019 2.9  1.9 - 3.7 g/dL (calc) Final  . AG Ratio 02/05/2019 1.5  1.0 - 2.5 (calc) Final  . Total Bilirubin 02/05/2019 0.4  0.2 - 1.2 mg/dL Final  . Alkaline phosphatase (APISO) 02/05/2019 85  37 - 153 U/L Final  . AST 02/05/2019 51* 10 - 35 U/L Final  . ALT 02/05/2019 83* 6 - 29 U/L Final  . Cholesterol 02/05/2019 151  <200 mg/dL Final  . HDL 02/05/2019 36* > OR = 50 mg/dL Final  . Triglycerides 02/05/2019 243* <150 mg/dL Final   Comment: . If a non-fasting specimen was collected, consider repeat triglyceride testing on a fasting specimen if clinically indicated.  Yates Decamp et al. J. of Clin. Lipidol. [id number]. .   . LDL Cholesterol (Calc) 02/05/2019 81  mg/dL (calc) Final   Comment: Reference range: <100 . Desirable range <100 mg/dL for primary prevention;   <70 mg/dL for patients with CHD or diabetic patients  with > or = 2 CHD risk factors. Marland Kitchen LDL-C is now calculated using the Martin-Hopkins  calculation, which is a validated novel method providing  better accuracy than the Friedewald equation in the  estimation of LDL-C.  Cresenciano Genre et al. Annamaria Helling. [id number]): 2061-2068  (http://education.[facility].com/faq/FAQ164)   . Total CHOL/HDL Ratio 02/05/2019 4.2  <5.0 (calc) Final  .  Non-HDL Cholesterol (Calc) 02/05/2019 115  <130 mg/dL (calc) Final   Comment: For patients with diabetes plus 1 major ASCVD risk  factor, treating to a non-HDL-C goal of <100 mg/dL  (LDL-C of <70 mg/dL) is considered a therapeutic  option.   Marland Kitchen TSH 02/05/2019 6.62* mIU/L Final   Comment:           Reference Range .           > or = 20 Years  0.40-4.50 .                Pregnancy Ranges           First trimester    0.26-2.66           Second trimester   0.55-2.73           Third trimester    0.43-2.91            Assessment & Plan:  Diabetes mellitus type 2 with  complications, uncontrolled ([place])  Pure hypercholesterolemia  Essential hypertension  Subclinical hypothyroidism  Hemoglobin A1c is slightly elevated at 7.1.  However patient can drop at a half a point simply by avoiding the cakes cookies the junk food and ice cream on a daily basis.  I have asked her to stop this and eat a more healthy diet and try to get at least 30 minutes of exercise every day and then recheck lab work in 6 months.  I would like to see the patient lose 10 to 20 pounds in that period of time to.  Her LDL cholesterol is controlled and her blood pressure today is excellent.  I will increase her levothyroxine from 50 to 75 mcg a day and recheck TSH in 2 months.  Patient's flu shot is up-to-date however she received Pneumovax 23 today.  Diabetic foot exam is normal.  Encourage diabetic eye exam.

## 2019-02-08 NOTE — Addendum Note (Signed)
Addended by: Vanice Sarah on: 02/08/2019 09:09 AM   Modules accepted: Orders

## 2019-02-20 ENCOUNTER — Other Ambulatory Visit: Payer: Self-pay | Admitting: Family Medicine

## 2019-02-20 ENCOUNTER — Encounter: Payer: Self-pay | Admitting: Family Medicine

## 2019-02-20 MED ORDER — VALACYCLOVIR HCL 1 G PO TABS: 1000.0000 mg | ORAL_TABLET | Freq: Three times a day (TID) | ORAL | 0 refills | Status: DC

## 2019-02-27 ENCOUNTER — Other Ambulatory Visit: Payer: Self-pay | Admitting: Family Medicine

## 2019-02-28 ENCOUNTER — Other Ambulatory Visit: Payer: Self-pay | Admitting: Family Medicine

## 2019-03-28 ENCOUNTER — Other Ambulatory Visit: Payer: Self-pay | Admitting: Family Medicine

## 2019-04-17 ENCOUNTER — Ambulatory Visit (INDEPENDENT_AMBULATORY_CARE_PROVIDER_SITE_OTHER): Payer: PRIVATE HEALTH INSURANCE | Admitting: Family Medicine

## 2019-04-17 DIAGNOSIS — J101 Influenza due to other identified influenza virus with other respiratory manifestations: Secondary | ICD-10-CM

## 2019-04-17 MED ORDER — HYDROCODONE-HOMATROPINE 5-1.5 MG/5ML PO SYRP: 5.0000 mL | ORAL_SOLUTION | Freq: Three times a day (TID) | ORAL | 0 refills | Status: DC | PRN

## 2019-04-17 MED ORDER — PREDNISONE 20 MG PO TABS: ORAL_TABLET | ORAL | 0 refills | Status: DC

## 2019-04-17 NOTE — Progress Notes (Signed)
Subjective:    Patient ID: Samantha Glover, female    DOB: 11-08-1965, 53 y.o.   MRN: [id number]  HPI Patient is a very pleasant 53 year old Caucasian female being seen today as a telephone visit.  She consents to be seen over the telephone.  Phone call began at 851.  Phone call concluded at 901.  Tuesday, 1 week ago, the patient developed a cough and chest congestion.  Her son had similar symptoms both she and her husband developed the symptoms.  The cough and chest congestion persisted over the week.  By Saturday she was having a fever to 99.7.  She went to an urgent care where she was tested for Covid.  The test returned negative for COVID-19.  However she was also tested for the flu and she tested positive for influenza A.  Due to her wheezing that they experienced at the urgent care, the patient was started on an albuterol inhaler and was given Mucinex and Tessalon Perles.  She states that they also gave her a Z-Pak.  Her fever has improved however she continues to wheeze.  She has audible wheezing over the telephone today.  She also is slightly short of breath.  She denies any further fever.  She denies any purulent sputum.  She denies any pleurisy.  She notices improvement when she takes the albuterol every 6 hours but this is temporary and fleeting. Past Medical History:  Diagnosis Date  . ADD (attention deficit disorder)   . Diabetes mellitus without complication ([place])   . Hypercholesterolemia   . Hypertension   . Thyroid disorder    hypothyroidism   Past Surgical History:  Procedure Laterality Date  . BREAST SURGERY    . CERVIX SURGERY    . EYE SURGERY     Current Outpatient Medications on File Prior to Visit  Medication Sig Dispense Refill  . atorvastatin (LIPITOR) 20 MG tablet TAKE 1 TABLET BY MOUTH  DAILY 90 tablet 3  . Insulin Pen Needle (BD PEN NEEDLE NANO U/F) 32G X 4 MM MISC USE DAILY WITH VICTOZA 100 each 3  . levothyroxine (SYNTHROID) 75 MCG tablet Take 1 tablet (75 mcg  total) by mouth daily. 90 tablet 3  . losartan (COZAAR) 50 MG tablet TAKE 1 TABLET BY MOUTH  DAILY 90 tablet 3  . metFORMIN (GLUCOPHAGE) 1000 MG tablet TAKE 1 TABLET BY MOUTH TWO  TIMES DAILY WITH MEALS 180 tablet 1  . valACYclovir (VALTREX) 1000 MG tablet Take 1 tablet (1,000 mg total) by mouth 3 (three) times daily. 21 tablet 0  . VICTOZA 18 MG/3ML SOPN INJECT 1.2 MG UNDER THE SKIN ONCE DAILY 18 mL 11   No current facility-administered medications on file prior to visit.   No Known Allergies Social History   Socioeconomic History  . Marital status: Married    Spouse name: Not on file  . Number of children: Not on file  . Years of education: Not on file  . Highest education level: Not on file  Occupational History  . Not on file  Tobacco Use  . Smoking status: Former Research scientist (life sciences)  . Smokeless tobacco: Never Used  Substance and Sexual Activity  . Alcohol use: No  . Drug use: No  . Sexual activity: Not on file    Comment: married.  2 sons.  Currently, stay at home mom.  Other Topics Concern  . Not on file  Social History Narrative  . Not on file   Social Determinants of Health  Financial Resource Strain:   . Difficulty of Paying Living Expenses: Not on file  Food Insecurity:   . Worried About Charity fundraiser in the Last Year: Not on file  . Ran Out of Food in the Last Year: Not on file  Transportation Needs:   . Lack of Transportation (Medical): Not on file  . Lack of Transportation (Non-Medical): Not on file  Physical Activity:   . Days of Exercise per Week: Not on file  . Minutes of Exercise per Session: Not on file  Stress:   . Feeling of Stress : Not on file  Social Connections:   . Frequency of Communication with Friends and Family: Not on file  . Frequency of Social Gatherings with Friends and Family: Not on file  . Attends Religious Services: Not on file  . Active Member of Clubs or Organizations: Not on file  . Attends Archivist Meetings: Not on  file  . Marital Status: Not on file  Intimate Partner Violence:   . Fear of Current or Ex-Partner: Not on file  . Emotionally Abused: Not on file  . Physically Abused: Not on file  . Sexually Abused: Not on file      Review of Systems  All other systems reviewed and are negative.      Objective:   Physical Exam        Assessment & Plan:  Influenza A with respiratory manifestations  Based on her audible wheezing, I believe she is experiencing bronchospasms related to her influenza A.  I do not believe that she has a secondary pneumonia however I would recommend starting her on a prednisone taper pack for the bronchospasms in addition to the albuterol that she is using every 4-6 hours.  Also gave the patient Hycodan 1 teaspoon every 8 hours as needed for cough so that she may rest better at night.  If her symptoms worsen I would recommend a chest x-ray to rule out pneumonia however she is already been on a Z-Pak so I feel that that is highly unlikely furthermore she denies any persistent fevers or purulent sputum.

## 2019-04-24 ENCOUNTER — Other Ambulatory Visit: Payer: Self-pay | Admitting: Family Medicine

## 2019-05-14 ENCOUNTER — Ambulatory Visit: Payer: PRIVATE HEALTH INSURANCE | Admitting: Family Medicine

## 2019-08-07 ENCOUNTER — Ambulatory Visit (INDEPENDENT_AMBULATORY_CARE_PROVIDER_SITE_OTHER): Payer: PRIVATE HEALTH INSURANCE | Admitting: Family Medicine

## 2019-08-07 ENCOUNTER — Encounter: Payer: Self-pay | Admitting: Family Medicine

## 2019-08-07 ENCOUNTER — Other Ambulatory Visit: Payer: Self-pay

## 2019-08-07 VITALS — BP 152/98 | HR 82 | Temp 97.0°F | Resp 18 | Ht 63.0 in | Wt 212.0 lb

## 2019-08-07 DIAGNOSIS — E118 Type 2 diabetes mellitus with unspecified complications: Secondary | ICD-10-CM | POA: Diagnosis not present

## 2019-08-07 DIAGNOSIS — E7800 Pure hypercholesterolemia, unspecified: Secondary | ICD-10-CM

## 2019-08-07 DIAGNOSIS — I10 Essential (primary) hypertension: Secondary | ICD-10-CM

## 2019-08-07 DIAGNOSIS — IMO0002 Reserved for concepts with insufficient information to code with codable children: Secondary | ICD-10-CM

## 2019-08-07 DIAGNOSIS — E038 Other specified hypothyroidism: Secondary | ICD-10-CM

## 2019-08-07 DIAGNOSIS — E039 Hypothyroidism, unspecified: Secondary | ICD-10-CM | POA: Diagnosis not present

## 2019-08-07 DIAGNOSIS — E1165 Type 2 diabetes mellitus with hyperglycemia: Secondary | ICD-10-CM

## 2019-08-07 MED ORDER — HYDROCHLOROTHIAZIDE 25 MG PO TABS: 25.0000 mg | ORAL_TABLET | Freq: Every day | ORAL | 3 refills | Status: DC

## 2019-08-07 NOTE — Progress Notes (Signed)
Subjective:    Patient ID: Samantha Glover, female    DOB: 1965-08-26, 54 y.o.   MRN: [id number]  HPI Patient is here today for a follow-up of her chronic medical problems.  She has a history of diabetes mellitus type 2.  Last A1c was 7.1 in October.  At that time, I encouraged "TLC" to achieve weight loss. We also increased levothyroxine.   Wt Readings from Last 3 Encounters:  08/07/19 212 lb (96.2 kg)  02/08/19 213 lb (96.6 kg)  06/20/18 209 lb (94.8 kg)  Unfortunately, weight is unchanged today.  She is already on Victoza to assist in weight loss.  Patient has been under a lot of stress so for this year.  Unfortunately her mother fell and broke both of her arms in December.  Therefore the patient has been having to take care of her mom.  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  This is caused her to not have as much free time to exercise.  She admits that she is not exercising regularly.  She also admits that she is not eating properly.  Her blood pressure today is elevated at 152/98.  She denies any chest pain shortness of breath or dyspnea on exertion.  She denies any numbness in the feet or burning or stinging in her feet.  Diabetic foot exam was performed today and is normal.  She denies any polyuria, polydipsia, or blurry vision.  However she does have a difficult time focusing at work.  She has a history of ADD.  She finds that she is having to reread the same items multiple times in order to process it.  Her mind is constantly racing.  She is easily distracted.  She is having a difficult time finishing her most time because of this.  She would like to restart her Adderall that she took in the past however I am concerned about her elevated blood pressure.  Past Medical History:  Diagnosis Date  . ADD (attention deficit disorder)   . Diabetes mellitus without complication ([place])   . Hypercholesterolemia   . Hypertension   . Thyroid disorder    hypothyroidism   Past Surgical History:  Procedure Laterality Date  . BREAST SURGERY    . CERVIX SURGERY    . EYE SURGERY     Current Outpatient Medications on File Prior to Visit  Medication Sig Dispense Refill  . atorvastatin (LIPITOR) 20 MG tablet TAKE 1 TABLET BY MOUTH  DAILY 90 tablet 3  . Insulin Pen Needle (BD PEN NEEDLE NANO U/F) 32G X 4 MM MISC USE DAILY WITH VICTOZA 100 each 3  . levothyroxine (SYNTHROID) 75 MCG tablet Take 1 tablet (75 mcg total) by mouth daily. 90 tablet 3  . losartan (COZAAR) 50 MG tablet TAKE 1 TABLET BY MOUTH  DAILY 90 tablet 3  . metFORMIN (GLUCOPHAGE) 1000 MG tablet TAKE 1 TABLET BY MOUTH  TWICE DAILY WITH MEALS 180 tablet 3  . valACYclovir (VALTREX) 1000 MG tablet Take 1 tablet (1,000 mg total) by mouth 3 (three) times daily. 21 tablet 0  . VICTOZA 18 MG/3ML SOPN INJECT 1.2 MG UNDER THE SKIN ONCE DAILY 18 mL 11   No current facility-administered medications on file prior to visit.   No Known Allergies Social History   Socioeconomic History  . Marital status: Married    Spouse name: Not on file  . Number of children: Not on file  . Years  of education: Not on file  . Highest education level: Not on file  Occupational History  . Not on file  Tobacco Use  . Smoking status: Former Research scientist (life sciences)  . Smokeless tobacco: Never Used  Substance and Sexual Activity  . Alcohol use: No  . Drug use: No  . Sexual activity: Not on file    Comment: married.  2 sons.  Currently, stay at home mom.  Other Topics Concern  . Not on file  Social History Narrative  . Not on file   Social Determinants of Health   Financial Resource Strain:   . Difficulty of Paying Living Expenses:   Food Insecurity:   . Worried About Charity fundraiser in the Last Year:   . Arboriculturist in the Last Year:   Transportation Needs:   . Film/video editor (Medical):   Marland Kitchen Lack of Transportation (Non-Medical):   Physical Activity:   . Days of Exercise per Week:   . Minutes of  Exercise per Session:   Stress:   . Feeling of Stress :   Social Connections:   . Frequency of Communication with Friends and Family:   . Frequency of Social Gatherings with Friends and Family:   . Attends Religious Services:   . Active Member of Clubs or Organizations:   . Attends Archivist Meetings:   Marland Kitchen Marital Status:   Intimate Partner Violence:   . Fear of Current or Ex-Partner:   . Emotionally Abused:   Marland Kitchen Physically Abused:   . Sexually Abused:       Review of Systems  All other systems reviewed and are negative.      Objective:   Physical Exam Vitals reviewed.  Constitutional:      General: She is not in acute distress.    Appearance: Normal appearance. She is obese. She is not ill-appearing or toxic-appearing.  Cardiovascular:     Rate and Rhythm: Normal rate and regular rhythm.     Pulses: Normal pulses.     Heart sounds: Normal heart sounds. No murmur. No friction rub. No gallop.   Pulmonary:     Effort: Pulmonary effort is normal. No respiratory distress.     Breath sounds: Normal breath sounds. No stridor. No wheezing, rhonchi or rales.  Chest:     Chest wall: No tenderness.  Abdominal:     General: Abdomen is flat. There is no distension.     Palpations: Abdomen is soft. There is no mass.     Tenderness: There is no abdominal tenderness. There is no guarding or rebound.  Musculoskeletal:     Right lower leg: No edema.     Left lower leg: No edema.  Neurological:     Mental Status: She is alert.            Assessment & Plan:  Diabetes mellitus type 2 with complications, uncontrolled ([place]) - Plan: Hemoglobin A1c, CBC with Differential/Platelet, COMPLETE METABOLIC PANEL WITH GFR, Lipid panel, TSH  Pure hypercholesterolemia - Plan: Hemoglobin A1c, CBC with Differential/Platelet, COMPLETE METABOLIC PANEL WITH GFR, Lipid panel, TSH  Essential hypertension - Plan: Hemoglobin A1c, CBC with Differential/Platelet, COMPLETE METABOLIC PANEL  WITH GFR, Lipid panel, TSH  Subclinical hypothyroidism - Plan: Hemoglobin A1c, CBC with Differential/Platelet, COMPLETE METABOLIC PANEL WITH GFR, Lipid panel, TSH  Recheck hemoglobin A1c today.  Goal hemoglobin A1c is less than 7.  Also check a fasting lipid panel.  Goal LDL cholesterol is less than 100.  Recheck TSH  to ensure adequate dosage of levothyroxine.  We will add hydrochlorothiazide 25 mg a day to lower her blood pressure.  She will recheck with me via email in 2 to 3 weeks with an update on her blood pressures.  If her blood pressures are better controlled, we will resume the low-dose of Adderall extended release 20 mg a day to help him focus at work so that she can complete her job duties on the timely fashion with less errors.  Today her thoughts are very scattered.  She does have tangential thoughts.  She does have signs and symptoms of attention deficit disorder.  I certainly agree with that.  I do feel that she would benefit from Adderall.  However I have also suggested that she get 30 minutes a day of aerobic exercise 5 days a week and try to lose 10 to 15 pounds at least in order to improve her long-term health.

## 2019-08-08 LAB — CBC WITH DIFFERENTIAL/PLATELET
Absolute Monocytes: 399 cells/uL (ref 200–950)
Basophils Absolute: 63 cells/uL (ref 0–200)
Basophils Relative: 0.9 %
Eosinophils Absolute: 168 cells/uL (ref 15–500)
Eosinophils Relative: 2.4 %
HCT: 43.8 % (ref 35.0–45.0)
Hemoglobin: 15 g/dL (ref 11.7–15.5)
Lymphs Abs: 2184 cells/uL (ref 850–3900)
MCH: 30.2 pg (ref 27.0–33.0)
MCHC: 34.2 g/dL (ref 32.0–36.0)
MCV: 88.3 fL (ref 80.0–100.0)
MPV: 11 fL (ref 7.5–12.5)
Monocytes Relative: 5.7 %
Neutro Abs: 4186 cells/uL (ref 1500–7800)
Neutrophils Relative %: 59.8 %
Platelets: 228 10*3/uL (ref 140–400)
RBC: 4.96 10*6/uL (ref 3.80–5.10)
RDW: 12.1 % (ref 11.0–15.0)
Total Lymphocyte: 31.2 %
WBC: 7 10*3/uL (ref 3.8–10.8)

## 2019-08-08 LAB — LIPID PANEL
Cholesterol: 147 mg/dL (ref <200)
HDL: 35 mg/dL — ABNORMAL LOW (ref >=50)
LDL Cholesterol (Calc): 83 mg/dL (calc)
Non-HDL Cholesterol (Calc): 112 mg/dL (calc) (ref <130)
Total CHOL/HDL Ratio: 4.2 (calc) (ref <5.0)
Triglycerides: 203 mg/dL — ABNORMAL HIGH (ref <150)

## 2019-08-08 LAB — COMPLETE METABOLIC PANEL WITH GFR
AG Ratio: 1.4 (calc) (ref 1.0–2.5)
ALT: 61 U/L — ABNORMAL HIGH (ref 6–29)
AST: 46 U/L — ABNORMAL HIGH (ref 10–35)
Albumin: 4.3 g/dL (ref 3.6–5.1)
Alkaline phosphatase (APISO): 75 U/L (ref 37–153)
BUN: 12 mg/dL (ref 7–25)
CO2: 27 mmol/L (ref 20–32)
Calcium: 10.3 mg/dL (ref 8.6–10.4)
Chloride: 102 mmol/L (ref 98–110)
Creat: 0.76 mg/dL (ref 0.50–1.05)
GFR, Est African American: 103 mL/min/{1.73_m2} (ref >=60)
GFR, Est Non African American: 89 mL/min/{1.73_m2} (ref >=60)
Globulin: 3 g/dL (calc) (ref 1.9–3.7)
Glucose, Bld: 190 mg/dL — ABNORMAL HIGH (ref 65–99)
Potassium: 4.3 mmol/L (ref 3.5–5.3)
Sodium: 138 mmol/L (ref 135–146)
Total Bilirubin: 0.7 mg/dL (ref 0.2–1.2)
Total Protein: 7.3 g/dL (ref 6.1–8.1)

## 2019-08-08 LAB — HEMOGLOBIN A1C
Hgb A1c MFr Bld: 8 % of total Hgb — ABNORMAL HIGH (ref <5.7)
Mean Plasma Glucose: 183 (calc)
eAG (mmol/L): 10.1 (calc)

## 2019-08-08 LAB — TSH: TSH: 2.47 mIU/L

## 2019-08-09 ENCOUNTER — Other Ambulatory Visit: Payer: Self-pay | Admitting: Family Medicine

## 2019-08-09 MED ORDER — EMPAGLIFLOZIN 25 MG PO TABS: 25.0000 mg | ORAL_TABLET | Freq: Every day | ORAL | 3 refills | Status: DC

## 2019-08-18 ENCOUNTER — Encounter: Payer: Self-pay | Admitting: Family Medicine

## 2019-08-20 ENCOUNTER — Encounter: Payer: Self-pay | Admitting: Family Medicine

## 2019-08-21 MED ORDER — LEVOTHYROXINE SODIUM 75 MCG PO TABS: 75.0000 ug | ORAL_TABLET | Freq: Every day | ORAL | 3 refills | Status: DC

## 2019-08-23 ENCOUNTER — Other Ambulatory Visit: Payer: Self-pay | Admitting: *Deleted

## 2019-10-16 ENCOUNTER — Other Ambulatory Visit: Payer: Self-pay | Admitting: Family Medicine

## 2020-01-21 ENCOUNTER — Other Ambulatory Visit: Payer: Self-pay | Admitting: Family Medicine

## 2020-03-19 ENCOUNTER — Other Ambulatory Visit: Payer: Self-pay

## 2020-03-19 ENCOUNTER — Ambulatory Visit (INDEPENDENT_AMBULATORY_CARE_PROVIDER_SITE_OTHER): Payer: PRIVATE HEALTH INSURANCE | Admitting: Family Medicine

## 2020-03-19 VITALS — BP 152/98 | Ht 63.0 in | Wt 185.0 lb

## 2020-03-19 DIAGNOSIS — Z23 Encounter for immunization: Secondary | ICD-10-CM

## 2020-03-21 ENCOUNTER — Other Ambulatory Visit: Payer: Self-pay | Admitting: Family Medicine

## 2020-04-15 ENCOUNTER — Other Ambulatory Visit: Payer: Self-pay | Admitting: Family Medicine

## 2020-04-17 ENCOUNTER — Other Ambulatory Visit: Payer: Self-pay | Admitting: Family Medicine

## 2020-08-11 ENCOUNTER — Other Ambulatory Visit: Payer: Self-pay | Admitting: Family Medicine

## 2020-08-26 ENCOUNTER — Other Ambulatory Visit: Payer: Self-pay | Admitting: Family Medicine

## 2020-09-01 ENCOUNTER — Other Ambulatory Visit: Payer: PRIVATE HEALTH INSURANCE

## 2020-09-03 ENCOUNTER — Other Ambulatory Visit: Payer: PRIVATE HEALTH INSURANCE

## 2020-09-03 ENCOUNTER — Other Ambulatory Visit: Payer: Self-pay

## 2020-09-03 DIAGNOSIS — E038 Other specified hypothyroidism: Secondary | ICD-10-CM

## 2020-09-03 DIAGNOSIS — I10 Essential (primary) hypertension: Secondary | ICD-10-CM

## 2020-09-03 DIAGNOSIS — E7800 Pure hypercholesterolemia, unspecified: Secondary | ICD-10-CM

## 2020-09-03 DIAGNOSIS — IMO0002 Reserved for concepts with insufficient information to code with codable children: Secondary | ICD-10-CM

## 2020-09-03 DIAGNOSIS — E1165 Type 2 diabetes mellitus with hyperglycemia: Secondary | ICD-10-CM

## 2020-09-04 LAB — HEMOGLOBIN A1C
Hgb A1c MFr Bld: 6.6 % of total Hgb — ABNORMAL HIGH (ref <5.7)
Mean Plasma Glucose: 143 mg/dL
eAG (mmol/L): 7.9 mmol/L

## 2020-09-04 LAB — COMPLETE METABOLIC PANEL WITH GFR
AG Ratio: 1.8 (calc) (ref 1.0–2.5)
ALT: 46 U/L — ABNORMAL HIGH (ref 6–29)
AST: 32 U/L (ref 10–35)
Albumin: 4.6 g/dL (ref 3.6–5.1)
Alkaline phosphatase (APISO): 61 U/L (ref 37–153)
BUN: 17 mg/dL (ref 7–25)
CO2: 28 mmol/L (ref 20–32)
Calcium: 10.5 mg/dL — ABNORMAL HIGH (ref 8.6–10.4)
Chloride: 101 mmol/L (ref 98–110)
Creat: 0.87 mg/dL (ref 0.50–1.05)
GFR, Est African American: 87 mL/min/{1.73_m2} (ref >=60)
GFR, Est Non African American: 75 mL/min/{1.73_m2} (ref >=60)
Globulin: 2.5 g/dL (calc) (ref 1.9–3.7)
Glucose, Bld: 133 mg/dL — ABNORMAL HIGH (ref 65–99)
Potassium: 4.1 mmol/L (ref 3.5–5.3)
Sodium: 141 mmol/L (ref 135–146)
Total Bilirubin: 0.5 mg/dL (ref 0.2–1.2)
Total Protein: 7.1 g/dL (ref 6.1–8.1)

## 2020-09-04 LAB — CBC WITH DIFFERENTIAL/PLATELET
Absolute Monocytes: 408 cells/uL (ref 200–950)
Basophils Absolute: 48 cells/uL (ref 0–200)
Basophils Relative: 0.7 %
Eosinophils Absolute: 177 cells/uL (ref 15–500)
Eosinophils Relative: 2.6 %
HCT: 44.5 % (ref 35.0–45.0)
Hemoglobin: 15 g/dL (ref 11.7–15.5)
Lymphs Abs: 2509 cells/uL (ref 850–3900)
MCH: 30.3 pg (ref 27.0–33.0)
MCHC: 33.7 g/dL (ref 32.0–36.0)
MCV: 89.9 fL (ref 80.0–100.0)
MPV: 11 fL (ref 7.5–12.5)
Monocytes Relative: 6 %
Neutro Abs: 3658 cells/uL (ref 1500–7800)
Neutrophils Relative %: 53.8 %
Platelets: 229 10*3/uL (ref 140–400)
RBC: 4.95 10*6/uL (ref 3.80–5.10)
RDW: 12.7 % (ref 11.0–15.0)
Total Lymphocyte: 36.9 %
WBC: 6.8 10*3/uL (ref 3.8–10.8)

## 2020-09-04 LAB — LIPID PANEL
Cholesterol: 143 mg/dL (ref <200)
HDL: 37 mg/dL — ABNORMAL LOW (ref >=50)
LDL Cholesterol (Calc): 80 mg/dL (calc)
Non-HDL Cholesterol (Calc): 106 mg/dL (calc) (ref <130)
Total CHOL/HDL Ratio: 3.9 (calc) (ref <5.0)
Triglycerides: 163 mg/dL — ABNORMAL HIGH (ref <150)

## 2020-09-04 LAB — TSH: TSH: 4.46 mIU/L

## 2020-09-04 LAB — MICROALBUMIN / CREATININE URINE RATIO
Creatinine, Urine: 112 mg/dL (ref 20–275)
Microalb Creat Ratio: 5 mcg/mg creat (ref <30)
Microalb, Ur: 0.6 mg/dL

## 2020-09-08 ENCOUNTER — Ambulatory Visit (INDEPENDENT_AMBULATORY_CARE_PROVIDER_SITE_OTHER): Payer: PRIVATE HEALTH INSURANCE | Admitting: Family Medicine

## 2020-09-08 ENCOUNTER — Other Ambulatory Visit: Payer: Self-pay

## 2020-09-08 ENCOUNTER — Encounter: Payer: Self-pay | Admitting: Family Medicine

## 2020-09-08 VITALS — BP 130/62 | HR 88 | Temp 97.9°F | Resp 14 | Ht 63.0 in | Wt 192.0 lb

## 2020-09-08 DIAGNOSIS — E079 Disorder of thyroid, unspecified: Secondary | ICD-10-CM

## 2020-09-08 DIAGNOSIS — I10 Essential (primary) hypertension: Secondary | ICD-10-CM

## 2020-09-08 DIAGNOSIS — E118 Type 2 diabetes mellitus with unspecified complications: Secondary | ICD-10-CM

## 2020-09-08 DIAGNOSIS — Z0001 Encounter for general adult medical examination with abnormal findings: Secondary | ICD-10-CM | POA: Diagnosis not present

## 2020-09-08 DIAGNOSIS — IMO0002 Reserved for concepts with insufficient information to code with codable children: Secondary | ICD-10-CM

## 2020-09-08 DIAGNOSIS — E1165 Type 2 diabetes mellitus with hyperglycemia: Secondary | ICD-10-CM

## 2020-09-08 DIAGNOSIS — E7800 Pure hypercholesterolemia, unspecified: Secondary | ICD-10-CM | POA: Diagnosis not present

## 2020-09-08 DIAGNOSIS — Z0000 Encounter for general adult medical examination without abnormal findings: Secondary | ICD-10-CM

## 2020-09-08 NOTE — Progress Notes (Signed)
Subjective:    Patient ID: Samantha Glover, female    DOB: Apr 18, 1966, 55 y.o.   MRN: [id number]   Wt Readings from Last 3 Encounters:  09/08/20 192 lb (87.1 kg)  08/07/19 212 lb (96.2 kg)  02/08/19 213 lb (96.6 kg)   Patient is here today for a complete physical exam.  Mammogram was just performed at her gynecologist office.  They are also performing her Pap smear.  She had a colonoscopy last year by Dr. Michail Sermon.  They recommended a repeat colonoscopy in 5 years per her report.  Therefore her cancer screening is up-to-date.  She is due for a booster on her COVID vaccination.  Otherwise her immunizations are up-to-date. Immunization History  Administered Date(s) Administered  . Influenza,inj,Quad PF,6+ Mos 04/17/2013, 01/29/2014, 03/18/2015, 05/24/2016, 02/05/2019, 03/19/2020  . Influenza-Unspecified 01/13/2018  . Janssen (J&J) SARS-COV-2 Vaccination 07/06/2019  . PPD Test 11/12/2015  . Pneumococcal Conjugate-13 02/08/2019  . Zoster Recombinat (Shingrix) 08/20/2019, 11/19/2019   Patient has drastically changed her diet and has lost 20 pounds since last year!  She has switched to a low carbohydrate diet.  I congratulated her on this.  Her most recent lab work is listed below. Lab on 09/03/2020  Component Date Value Ref Range Status  . WBC 09/03/2020 6.8  3.8 - 10.8 Thousand/uL Final  . RBC 09/03/2020 4.95  3.80 - 5.10 Million/uL Final  . Hemoglobin 09/03/2020 15.0  11.7 - 15.5 g/dL Final  . HCT 09/03/2020 44.5  35.0 - 45.0 % Final  . MCV 09/03/2020 89.9  80.0 - 100.0 fL Final  . MCH 09/03/2020 30.3  27.0 - 33.0 pg Final  . MCHC 09/03/2020 33.7  32.0 - 36.0 g/dL Final  . RDW 09/03/2020 12.7  11.0 - 15.0 % Final  . Platelets 09/03/2020 229  140 - 400 Thousand/uL Final  . MPV 09/03/2020 11.0  7.5 - 12.5 fL Final  . Neutro Abs 09/03/2020 3,658  1,500 - 7,800 cells/uL Final  . Lymphs Abs 09/03/2020 2,509  850 - 3,900 cells/uL Final  . Absolute Monocytes 09/03/2020 408  200 - 950  cells/uL Final  . Eosinophils Absolute 09/03/2020 177  15 - 500 cells/uL Final  . Basophils Absolute 09/03/2020 48  0 - 200 cells/uL Final  . Neutrophils Relative % 09/03/2020 53.8  % Final  . Total Lymphocyte 09/03/2020 36.9  % Final  . Monocytes Relative 09/03/2020 6.0  % Final  . Eosinophils Relative 09/03/2020 2.6  % Final  . Basophils Relative 09/03/2020 0.7  % Final  . Glucose, Bld 09/03/2020 133* 65 - 99 mg/dL Final   Comment: .            Fasting reference interval . For someone without known diabetes, a glucose value >125 mg/dL indicates that they may have diabetes and this should be confirmed with a follow-up test. .   . BUN 09/03/2020 17  7 - 25 mg/dL Final  . Creat 09/03/2020 0.87  0.50 - 1.05 mg/dL Final   Comment: For patients >34 years of age, the reference limit for Creatinine is approximately 13% higher for people identified as African-American. .   . GFR, Est Non African American 09/03/2020 75  > OR = 60 mL/min/1.37m2 Final  . GFR, Est African American 09/03/2020 87  > OR = 60 mL/min/1.78m2 Final  . BUN/Creatinine Ratio 63/04/6008 NOT APPLICABLE  6 - 22 (calc) Final  . Sodium 09/03/2020 141  135 - 146 mmol/L Final  . Potassium 09/03/2020 4.1  3.5 -  5.3 mmol/L Final  . Chloride 09/03/2020 101  98 - 110 mmol/L Final  . CO2 09/03/2020 28  20 - 32 mmol/L Final  . Calcium 09/03/2020 10.5* 8.6 - 10.4 mg/dL Final  . Total Protein 09/03/2020 7.1  6.1 - 8.1 g/dL Final  . Albumin 09/03/2020 4.6  3.6 - 5.1 g/dL Final  . Globulin 09/03/2020 2.5  1.9 - 3.7 g/dL (calc) Final  . AG Ratio 09/03/2020 1.8  1.0 - 2.5 (calc) Final  . Total Bilirubin 09/03/2020 0.5  0.2 - 1.2 mg/dL Final  . Alkaline phosphatase (APISO) 09/03/2020 61  37 - 153 U/L Final  . AST 09/03/2020 32  10 - 35 U/L Final  . ALT 09/03/2020 46* 6 - 29 U/L Final  . Hgb A1c MFr Bld 09/03/2020 6.6* <5.7 % of total Hgb Final   Comment: For someone without known diabetes, a hemoglobin A1c value of 6.5% or greater  indicates that they may have  diabetes and this should be confirmed with a follow-up  test. . For someone with known diabetes, a value <7% indicates  that their diabetes is well controlled and a value  greater than or equal to 7% indicates suboptimal  control. A1c targets should be individualized based on  duration of diabetes, age, comorbid conditions, and  other considerations. . Currently, no consensus exists regarding use of hemoglobin A1c for diagnosis of diabetes for children. .   . Mean Plasma Glucose 09/03/2020 143  mg/dL Final  . eAG (mmol/L) 09/03/2020 7.9  mmol/L Final  . Cholesterol 09/03/2020 143  <200 mg/dL Final  . HDL 09/03/2020 37* > OR = 50 mg/dL Final  . Triglycerides 09/03/2020 163* <150 mg/dL Final  . LDL Cholesterol (Calc) 09/03/2020 80  mg/dL (calc) Final   Comment: Reference range: <100 . Desirable range <100 mg/dL for primary prevention;   <70 mg/dL for patients with CHD or diabetic patients  with > or = 2 CHD risk factors. Marland Kitchen LDL-C is now calculated using the Martin-Hopkins  calculation, which is a validated novel method providing  better accuracy than the Friedewald equation in the  estimation of LDL-C.  Cresenciano Genre et al. Annamaria Helling. 8099;833(82): 2061-2068  (http://education.[facility].com/faq/FAQ164)   . Total CHOL/HDL Ratio 09/03/2020 3.9  <5.0 (calc) Final  . Non-HDL Cholesterol (Calc) 09/03/2020 106  <130 mg/dL (calc) Final   Comment: For patients with diabetes plus 1 major ASCVD risk  factor, treating to a non-HDL-C goal of <100 mg/dL  (LDL-C of <70 mg/dL) is considered a therapeutic  option.   . Creatinine, Urine 09/03/2020 112  20 - 275 mg/dL Final  . Microalb, Ur 09/03/2020 0.6  mg/dL Final   Comment: Reference Range Not established   . Microalb Creat Ratio 09/03/2020 5  <30 mcg/mg creat Final   Comment: . The ADA defines abnormalities in albumin excretion as follows: Marland Kitchen Albuminuria Category        Result (mcg/mg  creatinine) . Normal to Mildly increased   <30 Moderately increased         30-299  Severely increased           > OR = 300 . The ADA recommends that at least two of three specimens collected within a 3-6 month period be abnormal before considering a patient to be within a diagnostic category.   Marland Kitchen TSH 09/03/2020 4.46  mIU/L Final   Comment:           Reference Range .           >  or = 20 Years  0.40-4.50 .                Pregnancy Ranges           First trimester    0.26-2.66           Second trimester   0.55-2.73           Third trimester    0.43-2.91     Past Medical History:  Diagnosis Date  . ADD (attention deficit disorder)   . Diabetes mellitus without complication ([place])   . Hypercholesterolemia   . Hypertension   . Thyroid disorder    hypothyroidism   Past Surgical History:  Procedure Laterality Date  . BREAST SURGERY    . CERVIX SURGERY    . EYE SURGERY     Current Outpatient Medications on File Prior to Visit  Medication Sig Dispense Refill  . atorvastatin (LIPITOR) 20 MG tablet TAKE 1 TABLET BY MOUTH  DAILY 90 tablet 3  . BD PEN NEEDLE NANO 2ND GEN 32G X 4 MM MISC USE DAILY WITH VICTOZA 100 each 3  . hydrochlorothiazide (HYDRODIURIL) 25 MG tablet TAKE 1 TABLET BY MOUTH EVERY DAY 90 tablet 0  . levothyroxine (SYNTHROID) 75 MCG tablet Take 1 tablet (75 mcg total) by mouth daily. 90 tablet 3  . losartan (COZAAR) 50 MG tablet TAKE 1 TABLET BY MOUTH  DAILY 90 tablet 3  . metFORMIN (GLUCOPHAGE) 1000 MG tablet TAKE 1 TABLET BY MOUTH  TWICE DAILY WITH MEALS 180 tablet 3  . VICTOZA 18 MG/3ML SOPN INJECT 1.2 MG UNDER THE SKIN ONCE DAILY 9 mL 11   No current facility-administered medications on file prior to visit.   No Known Allergies Social History   Socioeconomic History  . Marital status: Married    Spouse name: Not on file  . Number of children: Not on file  . Years of education: Not on file  . Highest education level: Not on file  Occupational History   . Not on file  Tobacco Use  . Smoking status: Former Research scientist (life sciences)  . Smokeless tobacco: Never Used  Substance and Sexual Activity  . Alcohol use: No  . Drug use: No  . Sexual activity: Not on file    Comment: married.  2 sons.  Currently, stay at home mom.  Other Topics Concern  . Not on file  Social History Narrative  . Not on file   Social Determinants of Health   Financial Resource Strain: Not on file  Food Insecurity: Not on file  Transportation Needs: Not on file  Physical Activity: Not on file  Stress: Not on file  Social Connections: Not on file  Intimate Partner Violence: Not on file      Review of Systems  All other systems reviewed and are negative.      Objective:   Physical Exam Vitals reviewed.  Constitutional:      General: She is not in acute distress.    Appearance: Normal appearance. She is obese. She is not ill-appearing or toxic-appearing.  Cardiovascular:     Rate and Rhythm: Normal rate and regular rhythm.     Pulses: Normal pulses.     Heart sounds: Normal heart sounds. No murmur heard. No friction rub. No gallop.   Pulmonary:     Effort: Pulmonary effort is normal. No respiratory distress.     Breath sounds: Normal breath sounds. No stridor. No wheezing, rhonchi or rales.  Chest:  Chest wall: No tenderness.  Abdominal:     General: Abdomen is flat. There is no distension.     Palpations: Abdomen is soft. There is no mass.     Tenderness: There is no abdominal tenderness. There is no guarding or rebound.  Musculoskeletal:     Right lower leg: No edema.     Left lower leg: No edema.  Neurological:     Mental Status: She is alert.            Assessment & Plan:  General medical exam  Essential hypertension  Diabetes mellitus type 2 with complications, uncontrolled ([place])  Pure hypercholesterolemia  Thyroid disorder  Patient's blood pressure today is outstanding.  A1c is acceptable at 6.1.  Cholesterol is excellent.  TSH is  within therapeutic range.  Therefore I am very happy with her blood work.  I am extremely happy with the lifestyle changes she has made in the 20 pounds of weight loss.  I have encouraged her to keep up this trend.  If she can lose another 10 to 15 pounds, I believe we may be able to start discontinuing medication.  Would recommend repeating fasting lab work in 6 months.  Cancer screening including Pap smear and mammogram are performed by her gynecologist.  Colonoscopy is up-to-date.  Encouraged her to get a booster on her COVID vaccination

## 2020-09-17 ENCOUNTER — Encounter: Payer: Self-pay | Admitting: *Deleted

## 2020-09-19 ENCOUNTER — Other Ambulatory Visit: Payer: Self-pay | Admitting: Family Medicine

## 2020-09-29 ENCOUNTER — Telehealth: Payer: Self-pay | Admitting: *Deleted

## 2020-09-29 MED ORDER — NIRMATRELVIR/RITONAVIR (PAXLOVID)TABLET: 3.0000 | ORAL_TABLET | Freq: Two times a day (BID) | ORAL | 0 refills | Status: AC

## 2020-09-29 NOTE — Telephone Encounter (Signed)
Patient tested positive for COVID on 09/27/2020.Marland Kitchen   Sx include cough, head congestion, sore throat, and loss of smell. States that she was treated for strept throat.  Advised to continue symptom management with OTC medications: Tylenol/ Ibuprofen for fever/ body aches, Mucinex/ Delsym for cough/ chest congestion, Afrin/Sudafed/nasal saline for sinus pressure/ nasal congestion.  GFR 75. Order for Paxlovid sent to pharmacy. Advised possible SE include nausea, diarrhea, loss of taste and hypertension.  If chest pain, shortness of breath, fever >104 that is unresponsive to antipyretics noted, advised to go to ER for evaluation.   Advised that the CDC recommends the following criteria prior to ending isolation in non- vaccinated persons at least 10 days since symptoms onset AND 3 days fever free without antipyretics (Tylenol or Ibuprofen) AND improvement in respiratory symptoms.

## 2020-09-30 ENCOUNTER — Telehealth: Payer: Self-pay

## 2020-10-03 NOTE — Telephone Encounter (Signed)
Error

## 2020-11-03 ENCOUNTER — Encounter: Payer: Self-pay | Admitting: Family Medicine

## 2020-11-03 ENCOUNTER — Ambulatory Visit (INDEPENDENT_AMBULATORY_CARE_PROVIDER_SITE_OTHER): Payer: PRIVATE HEALTH INSURANCE | Admitting: Family Medicine

## 2020-11-03 ENCOUNTER — Other Ambulatory Visit: Payer: Self-pay

## 2020-11-03 VITALS — BP 126/74 | HR 81 | Temp 98.6°F | Ht 63.0 in | Wt 185.0 lb

## 2020-11-03 DIAGNOSIS — R2231 Localized swelling, mass and lump, right upper limb: Secondary | ICD-10-CM | POA: Diagnosis not present

## 2020-11-03 NOTE — Progress Notes (Signed)
Subjective:    Patient ID: Samantha Glover, female    DOB: 12-08-1965, 55 y.o.   MRN: [id number]  Medication Refill Patient has a mass on her right forearm.  She has noticed it for approximately a month or so.  It is 2.5 cm in diameter.  It is soft, spongy, with poorly defined borders.  It is freely mobile.  It is nontender.  I believe is most likely a lipoma. Past Medical History:  Diagnosis Date   ADD (attention deficit disorder)    Diabetes mellitus without complication ([facility])    Hypercholesterolemia    Hypertension    Thyroid disorder    hypothyroidism   Past Surgical History:  Procedure Laterality Date   BREAST SURGERY     CERVIX SURGERY     EYE SURGERY     Current Outpatient Medications on File Prior to Visit  Medication Sig Dispense Refill   atorvastatin (LIPITOR) 20 MG tablet TAKE 1 TABLET BY MOUTH  DAILY 90 tablet 3   BD PEN NEEDLE NANO 2ND GEN 32G X 4 MM MISC USE DAILY WITH VICTOZA 100 each 3   hydrochlorothiazide (HYDRODIURIL) 25 MG tablet TAKE 1 TABLET BY MOUTH EVERY DAY 90 tablet 0   levothyroxine (SYNTHROID) 75 MCG tablet Take 1 tablet (75 mcg total) by mouth daily. 90 tablet 3   losartan (COZAAR) 50 MG tablet TAKE 1 TABLET BY MOUTH  DAILY 90 tablet 3   metFORMIN (GLUCOPHAGE) 1000 MG tablet TAKE 1 TABLET BY MOUTH  TWICE DAILY WITH MEALS 180 tablet 3   VICTOZA 18 MG/3ML SOPN INJECT 1.2 MG UNDER THE SKIN ONCE DAILY 9 mL 11   No current facility-administered medications on file prior to visit.   No Known Allergies Social History   Socioeconomic History   Marital status: Married    Spouse name: Not on file   Number of children: Not on file   Years of education: Not on file   Highest education level: Not on file  Occupational History   Not on file  Tobacco Use   Smoking status: Former    Pack years: 0.00   Smokeless tobacco: Never  Substance and Sexual Activity   Alcohol use: No   Drug use: No   Sexual activity: Not on file    Comment: married.  2 sons.   Currently, stay at home mom.  Other Topics Concern   Not on file  Social History Narrative   Not on file   Social Determinants of Health   Financial Resource Strain: Not on file  Food Insecurity: Not on file  Transportation Needs: Not on file  Physical Activity: Not on file  Stress: Not on file  Social Connections: Not on file  Intimate Partner Violence: Not on file      Review of Systems  All other systems reviewed and are negative.     Objective:   Physical Exam Vitals reviewed.  Constitutional:      General: She is not in acute distress.    Appearance: Normal appearance. She is obese. She is not ill-appearing or toxic-appearing.  Cardiovascular:     Rate and Rhythm: Normal rate and regular rhythm.     Pulses: Normal pulses.     Heart sounds: Normal heart sounds. No murmur heard.   No friction rub. No gallop.  Pulmonary:     Effort: Pulmonary effort is normal. No respiratory distress.     Breath sounds: Normal breath sounds. No stridor. No wheezing, rhonchi or rales.  Chest:     Chest wall: No tenderness.  Abdominal:     General: Abdomen is flat. There is no distension.     Palpations: Abdomen is soft. There is no mass.     Tenderness: There is no abdominal tenderness. There is no guarding or rebound.  Musculoskeletal:       Arms:     Right lower leg: No edema.     Left lower leg: No edema.  Neurological:     Mental Status: She is alert.           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  I offer the patient an excisional biopsy.  She does not want a large scar but she would like to try an ultrasound-guided fine-needle biopsy of the mass to confirm a benign lipoma.  I will schedule this for her

## 2020-11-05 ENCOUNTER — Telehealth: Payer: Self-pay | Admitting: *Deleted

## 2020-11-05 DIAGNOSIS — R2231 Localized swelling, mass and lump, right upper limb: Secondary | ICD-10-CM

## 2020-11-05 NOTE — Telephone Encounter (Signed)
Received call from [facility] with [facility] [phone]~ telephone.   Reports that biopsy order has been placed, but there is no prior imaging done on possible lipoma. States that prior to the biopsy, further imaging is required.   Please advise.

## 2020-11-06 NOTE — Telephone Encounter (Signed)
[place] R forearm ordered.   Call placed to patient and patient made aware.

## 2020-11-10 ENCOUNTER — Telehealth: Payer: Self-pay | Admitting: Family Medicine

## 2020-11-10 ENCOUNTER — Other Ambulatory Visit: Payer: Self-pay | Admitting: *Deleted

## 2020-11-10 DIAGNOSIS — R2231 Localized swelling, mass and lump, right upper limb: Secondary | ICD-10-CM

## 2020-11-10 NOTE — Telephone Encounter (Signed)
[place] ordered on R Lower Extremity in error.   Order placed.

## 2020-11-10 NOTE — Telephone Encounter (Signed)
Received call from [place] at [facility] to request clarification. Dx and type of scan requested don't match. Please advise at [phone].

## 2020-11-11 ENCOUNTER — Other Ambulatory Visit: Payer: Self-pay | Admitting: Family Medicine

## 2020-11-12 ENCOUNTER — Ambulatory Visit
Admission: RE | Admit: 2020-11-12 | Discharge: 2020-11-12 | Disposition: A | Discharge status: Home / Self Care | Payer: PRIVATE HEALTH INSURANCE | Source: Ambulatory Visit | Attending: Family Medicine | Admitting: Family Medicine

## 2020-11-12 DIAGNOSIS — R2231 Localized swelling, mass and lump, right upper limb: Secondary | ICD-10-CM

## 2020-11-13 ENCOUNTER — Encounter: Payer: Self-pay | Admitting: Family Medicine

## 2020-11-14 ENCOUNTER — Other Ambulatory Visit: Payer: Self-pay | Admitting: Family Medicine

## 2020-11-14 ENCOUNTER — Encounter (HOSPITAL_COMMUNITY): Payer: Self-pay | Admitting: Radiology

## 2020-11-14 DIAGNOSIS — R2231 Localized swelling, mass and lump, right upper limb: Secondary | ICD-10-CM

## 2020-11-14 NOTE — Progress Notes (Signed)
Patient Demographics  Patient Name  Kaele, Oplinger Legal Sex  Female DOB  Nov 24, 1965 SSN  [id number] Address  [address]  [place] [address] Phone  [phone] (Home)  [phone] (Mobile) *Preferred*     RE: [place] FNA SOFT TISSUE Received: Today Arne Cleveland, MD  Garth Bigness D No lesion on recent imaging to allow/require image-guided biopsy   DD[facility]         Previous Messages    ----- Message -----  From: Garth Bigness D  Sent: 11/14/2020   9:03 AM EDT  To: [facility] Procedure Requests  Subject: [place] FNA SOFT TISSUE                             Procedure:  [place] FNA SOFT TISSUE   Reason:  Subcutaneous mass of right forearm   History:  Korea in computer   Provider:  Jenna Luo T   Provider Contact:   [phone]

## 2020-11-17 ENCOUNTER — Other Ambulatory Visit: Payer: Self-pay | Admitting: Family Medicine

## 2020-12-23 ENCOUNTER — Other Ambulatory Visit: Payer: Self-pay | Admitting: Family Medicine

## 2021-01-29 ENCOUNTER — Ambulatory Visit: Payer: PRIVATE HEALTH INSURANCE

## 2021-02-14 ENCOUNTER — Other Ambulatory Visit: Payer: Self-pay | Admitting: Family Medicine

## 2021-02-21 ENCOUNTER — Other Ambulatory Visit: Payer: Self-pay | Admitting: Family Medicine

## 2021-03-09 ENCOUNTER — Encounter: Payer: Self-pay | Admitting: Family Medicine

## 2021-03-09 ENCOUNTER — Ambulatory Visit (INDEPENDENT_AMBULATORY_CARE_PROVIDER_SITE_OTHER): Payer: PRIVATE HEALTH INSURANCE | Admitting: Family Medicine

## 2021-03-09 ENCOUNTER — Other Ambulatory Visit: Payer: Self-pay

## 2021-03-09 VITALS — BP 128/72 | HR 81 | Temp 97.4°F | Resp 18 | Ht 63.0 in | Wt 188.0 lb

## 2021-03-09 DIAGNOSIS — E119 Type 2 diabetes mellitus without complications: Secondary | ICD-10-CM

## 2021-03-09 DIAGNOSIS — I10 Essential (primary) hypertension: Secondary | ICD-10-CM

## 2021-03-09 DIAGNOSIS — Z23 Encounter for immunization: Secondary | ICD-10-CM

## 2021-03-09 DIAGNOSIS — E7800 Pure hypercholesterolemia, unspecified: Secondary | ICD-10-CM

## 2021-03-09 MED ORDER — ALPRAZOLAM 0.5 MG PO TABS: 0.5000 mg | ORAL_TABLET | Freq: Three times a day (TID) | ORAL | 0 refills | Status: DC | PRN

## 2021-03-09 MED ORDER — VICTOZA 18 MG/3ML ~~LOC~~ SOPN: PEN_INJECTOR | SUBCUTANEOUS | 11 refills | Status: DC

## 2021-03-09 NOTE — Progress Notes (Signed)
Subjective:    Patient ID: Samantha Glover, female    DOB: [birth year], 55 y.o.   MRN: [id number]  Patient is here today for a follow-up of her diabetes.  She denies any polyuria polydipsia or blurry vision.  Her blood pressure is excellent today 128/72.  She is started eating an Atkins diet and has been successfully losing weight.  She denies any neuropathy.  She denies any chest pain or shortness of breath or dyspnea on exertion. Past Medical History:  Diagnosis Date   ADD (attention deficit disorder)    Diabetes mellitus without complication ([place])    Hypercholesterolemia    Hypertension    Thyroid disorder    hypothyroidism   Past Surgical History:  Procedure Laterality Date   BREAST SURGERY     CERVIX SURGERY     EYE SURGERY     Current Outpatient Medications on File Prior to Visit  Medication Sig Dispense Refill   atorvastatin (LIPITOR) 20 MG tablet TAKE 1 TABLET BY MOUTH  DAILY 90 tablet 3   BD PEN NEEDLE NANO 2ND GEN 32G X 4 MM MISC USE DAILY WITH VICTOZA 100 each 3   hydrochlorothiazide (HYDRODIURIL) 25 MG tablet TAKE 1 TABLET BY MOUTH EVERY DAY 90 tablet 0   levothyroxine (SYNTHROID) 75 MCG tablet TAKE 1 TABLET BY MOUTH EVERY DAY 90 tablet 3   losartan (COZAAR) 50 MG tablet TAKE 1 TABLET BY MOUTH  DAILY 90 tablet 3   metFORMIN (GLUCOPHAGE) 1000 MG tablet TAKE 1 TABLET BY MOUTH  TWICE DAILY WITH MEALS 180 tablet 3   No current facility-administered medications on file prior to visit.   No Known Allergies Social History   Socioeconomic History   Marital status: Married    Spouse name: Not on file   Number of children: Not on file   Years of education: Not on file   Highest education level: Not on file  Occupational History   Not on file  Tobacco Use   Smoking status: Former   Smokeless tobacco: Never  Substance and Sexual Activity   Alcohol use: No   Drug use: No   Sexual activity: Not on file    Comment: married.  2 sons.  Currently, stay at home mom.  Other  Topics Concern   Not on file  Social History Narrative   Not on file   Social Determinants of Health   Financial Resource Strain: Not on file  Food Insecurity: Not on file  Transportation Needs: Not on file  Physical Activity: Not on file  Stress: Not on file  Social Connections: Not on file  Intimate Partner Violence: Not on file      Review of Systems  All other systems reviewed and are negative.     Objective:   Physical Exam Vitals reviewed.  Constitutional:      General: She is not in acute distress.    Appearance: Normal appearance. She is obese. She is not ill-appearing or toxic-appearing.  Cardiovascular:     Rate and Rhythm: Normal rate and regular rhythm.     Pulses: Normal pulses.     Heart sounds: Normal heart sounds. No murmur heard.   No friction rub. No gallop.  Pulmonary:     Effort: Pulmonary effort is normal. No respiratory distress.     Breath sounds: Normal breath sounds. No stridor. No wheezing, rhonchi or rales.  Chest:     Chest wall: No tenderness.  Abdominal:     General: Abdomen is flat.  There is no distension.     Palpations: Abdomen is soft. There is no mass.     Tenderness: There is no abdominal tenderness. There is no guarding or rebound.  Musculoskeletal:     Right lower leg: No edema.     Left lower leg: No edema.  Neurological:     Mental Status: She is alert.           Assessment & Plan:  Need for immunization against influenza - Plan: Flu Vaccine QUAD 27mo+IM (Fluarix, Fluzone & Alfiuria Quad PF)  Essential hypertension  Pure hypercholesterolemia  Diabetes mellitus without complication ([place]) - Plan: COMPLETE METABOLIC PANEL WITH GFR, Hemoglobin A1c, Microalbumin, urine, TSH, Lipid panel  Recheck hemoglobin A1c today.  Goal hemoglobin A1c is less than 7.  Also check a fasting lipid panel.  Goal LDL cholesterol is less than 100.  Recheck TSH to ensure adequate dosage of levothyroxine.  Blood pressure today is excellent.   Congratulated the patient on her weight loss and encouraged 30 minutes a day 5 days a week of aerobic exercise.  She is asking for a prescription for Xanax which she plans to use sparingly at her son's upcoming wedding.  I cautioned her to be very cautious with this and see how she responds in the medicine.

## 2021-03-10 LAB — COMPLETE METABOLIC PANEL WITH GFR
AG Ratio: 1.6 (calc) (ref 1.0–2.5)
ALT: 24 U/L (ref 6–29)
AST: 20 U/L (ref 10–35)
Albumin: 4.9 g/dL (ref 3.6–5.1)
Alkaline phosphatase (APISO): 65 U/L (ref 37–153)
BUN: 14 mg/dL (ref 7–25)
CO2: 29 mmol/L (ref 20–32)
Calcium: 10.4 mg/dL (ref 8.6–10.4)
Chloride: 100 mmol/L (ref 98–110)
Creat: 0.84 mg/dL (ref 0.50–1.03)
Globulin: 3 g/dL (calc) (ref 1.9–3.7)
Glucose, Bld: 89 mg/dL (ref 65–99)
Potassium: 4.2 mmol/L (ref 3.5–5.3)
Sodium: 139 mmol/L (ref 135–146)
Total Bilirubin: 0.6 mg/dL (ref 0.2–1.2)
Total Protein: 7.9 g/dL (ref 6.1–8.1)
eGFR: 82 mL/min/{1.73_m2} (ref >=60)

## 2021-03-10 LAB — LIPID PANEL
Cholesterol: 146 mg/dL (ref <200)
HDL: 41 mg/dL — ABNORMAL LOW (ref >=50)
LDL Cholesterol (Calc): 81 mg/dL (calc)
Non-HDL Cholesterol (Calc): 105 mg/dL (calc) (ref <130)
Total CHOL/HDL Ratio: 3.6 (calc) (ref <5.0)
Triglycerides: 143 mg/dL (ref <150)

## 2021-03-10 LAB — MICROALBUMIN, URINE: Microalb, Ur: 0.8 mg/dL

## 2021-03-10 LAB — HEMOGLOBIN A1C
Hgb A1c MFr Bld: 6.1 % of total Hgb — ABNORMAL HIGH (ref <5.7)
Mean Plasma Glucose: 128 mg/dL
eAG (mmol/L): 7.1 mmol/L

## 2021-03-10 LAB — TSH: TSH: 2.58 mIU/L

## 2021-03-13 ENCOUNTER — Ambulatory Visit: Payer: PRIVATE HEALTH INSURANCE | Admitting: Family Medicine

## 2021-03-15 ENCOUNTER — Other Ambulatory Visit: Payer: Self-pay | Admitting: Family Medicine

## 2021-04-29 ENCOUNTER — Telehealth: Payer: Self-pay

## 2021-04-29 NOTE — Telephone Encounter (Signed)
Patient left message complaining of what she thinks may be shingles.  Left message for patient to call back and schedule appointment for evaluation.

## 2021-06-28 ENCOUNTER — Other Ambulatory Visit: Payer: Self-pay | Admitting: Family Medicine

## 2021-07-14 ENCOUNTER — Encounter: Payer: Self-pay | Admitting: Family Medicine

## 2021-07-16 ENCOUNTER — Other Ambulatory Visit: Payer: Self-pay | Admitting: Family Medicine

## 2021-07-16 MED ORDER — TIRZEPATIDE 5 MG/0.5ML ~~LOC~~ SOAJ: 5.0000 mg | SUBCUTANEOUS | 3 refills | Status: DC

## 2021-08-20 LAB — HM DIABETES EYE EXAM

## 2021-08-22 ENCOUNTER — Other Ambulatory Visit: Payer: Self-pay | Admitting: Family Medicine

## 2021-09-08 ENCOUNTER — Other Ambulatory Visit: Payer: PRIVATE HEALTH INSURANCE

## 2021-09-08 DIAGNOSIS — I10 Essential (primary) hypertension: Secondary | ICD-10-CM

## 2021-09-08 DIAGNOSIS — E079 Disorder of thyroid, unspecified: Secondary | ICD-10-CM

## 2021-09-08 DIAGNOSIS — E7800 Pure hypercholesterolemia, unspecified: Secondary | ICD-10-CM

## 2021-09-10 ENCOUNTER — Ambulatory Visit (INDEPENDENT_AMBULATORY_CARE_PROVIDER_SITE_OTHER): Payer: PRIVATE HEALTH INSURANCE | Admitting: Family Medicine

## 2021-09-10 ENCOUNTER — Other Ambulatory Visit: Payer: Self-pay | Admitting: Family Medicine

## 2021-09-10 ENCOUNTER — Encounter: Payer: Self-pay | Admitting: Family Medicine

## 2021-09-10 VITALS — BP 118/82 | HR 80 | Temp 97.7°F | Ht 63.0 in | Wt 188.8 lb

## 2021-09-10 DIAGNOSIS — E7800 Pure hypercholesterolemia, unspecified: Secondary | ICD-10-CM

## 2021-09-10 DIAGNOSIS — E119 Type 2 diabetes mellitus without complications: Secondary | ICD-10-CM

## 2021-09-10 DIAGNOSIS — Z1231 Encounter for screening mammogram for malignant neoplasm of breast: Secondary | ICD-10-CM

## 2021-09-10 DIAGNOSIS — Z0000 Encounter for general adult medical examination without abnormal findings: Secondary | ICD-10-CM

## 2021-09-10 DIAGNOSIS — I10 Essential (primary) hypertension: Secondary | ICD-10-CM | POA: Diagnosis not present

## 2021-09-10 MED ORDER — TIRZEPATIDE 7.5 MG/0.5ML ~~LOC~~ SOAJ: 7.5000 mg | SUBCUTANEOUS | 1 refills | Status: DC

## 2021-09-10 NOTE — Progress Notes (Signed)
Subjective:    Patient ID: Samantha Glover, female    DOB: August 05, 1965, 56 y.o.   MRN: [id number]   Patient is a very sweet 56 year old Caucasian female who is here today for complete physical exam.  Her weight had gone about 200 pounds.  At that point she switched from Victoza to [facility].  Since making the switch, she has lost down to 188 pounds!  She is tolerating Mounjaro without any side effects.  She denies any nausea vomiting or abdominal pain.  Her last colonoscopy was approximately 2 years ago.  She is not due until 3 years from now.  Her mammogram and Pap smear were performed earlier this year by her gynecologist.  She is not yet due for a bone density test.  Her blood pressure today is outstanding at [place].  Her most recent lab work is listed below.  Her A1c has not returned yet but the rest of her blood work looks okay except for some very mild elevation in her liver function test which I presume is due to fatty liver disease. Lab on 09/08/2021  Component Date Value Ref Range Status   TSH 09/08/2021 4.06  0.40 - 4.50 mIU/L Final   Glucose, Bld 09/08/2021 117 (H)  65 - 99 mg/dL Final   Comment: .            Fasting reference interval . For someone without known diabetes, a glucose value between 100 and 125 mg/dL is consistent with prediabetes and should be confirmed with a follow-up test. .    BUN 09/08/2021 15  7 - 25 mg/dL Final   Creat 09/08/2021 0.83  0.50 - 1.03 mg/dL Final   BUN/Creatinine Ratio 95/62/1308 NOT APPLICABLE  6 - 22 (calc) Final   Sodium 09/08/2021 140  135 - 146 mmol/L Final   Potassium 09/08/2021 3.9  3.5 - 5.3 mmol/L Final   Chloride 09/08/2021 100  98 - 110 mmol/L Final   CO2 09/08/2021 29  20 - 32 mmol/L Final   Calcium 09/08/2021 10.1  8.6 - 10.4 mg/dL Final   Total Protein 09/08/2021 7.3  6.1 - 8.1 g/dL Final   Albumin 09/08/2021 4.6  3.6 - 5.1 g/dL Final   Globulin 09/08/2021 2.7  1.9 - 3.7 g/dL (calc) Final   AG Ratio 09/08/2021 1.7  1.0 - 2.5  (calc) Final   Total Bilirubin 09/08/2021 0.7  0.2 - 1.2 mg/dL Final   Alkaline phosphatase (APISO) 09/08/2021 69  37 - 153 U/L Final   AST 09/08/2021 26  10 - 35 U/L Final   ALT 09/08/2021 40 (H)  6 - 29 U/L Final   WBC 09/08/2021 7.5  3.8 - 10.8 Thousand/uL Final   RBC 09/08/2021 4.71  3.80 - 5.10 Million/uL Final   Hemoglobin 09/08/2021 14.1  11.7 - 15.5 g/dL Final   HCT 09/08/2021 41.1  35.0 - 45.0 % Final   MCV 09/08/2021 87.3  80.0 - 100.0 fL Final   MCH 09/08/2021 29.9  27.0 - 33.0 pg Final   MCHC 09/08/2021 34.3  32.0 - 36.0 g/dL Final   RDW 09/08/2021 12.2  11.0 - 15.0 % Final   Platelets 09/08/2021 236  140 - 400 Thousand/uL Final   MPV 09/08/2021 11.0  7.5 - 12.5 fL Final   Neutro Abs 09/08/2021 4,283  1,500 - 7,800 cells/uL Final   Lymphs Abs 09/08/2021 2,430  850 - 3,900 cells/uL Final   Absolute Monocytes 09/08/2021 503  200 - 950 cells/uL Final   Eosinophils  Absolute 09/08/2021 203  15 - 500 cells/uL Final   Basophils Absolute 09/08/2021 83  0 - 200 cells/uL Final   Neutrophils Relative % 09/08/2021 57.1  % Final   Total Lymphocyte 09/08/2021 32.4  % Final   Monocytes Relative 09/08/2021 6.7  % Final   Eosinophils Relative 09/08/2021 2.7  % Final   Basophils Relative 09/08/2021 1.1  % Final   Cholesterol 09/08/2021 139  <200 mg/dL Final   HDL 09/08/2021 39 (L)  > OR = 50 mg/dL Final   Triglycerides 09/08/2021 215 (H)  <150 mg/dL Final   Comment: . If a non-fasting specimen was collected, consider repeat triglyceride testing on a fasting specimen if clinically indicated.  Yates Decamp et al. J. of Clin. Lipidol. 6384;5:364-680. Marland Kitchen    LDL Cholesterol (Calc) 09/08/2021 69  mg/dL (calc) Final   Comment: Reference range: <100 . Desirable range <100 mg/dL for primary prevention;   <70 mg/dL for patients with CHD or diabetic patients  with > or = 2 CHD risk factors. Marland Kitchen LDL-C is now calculated using the Martin-Hopkins  calculation, which is a validated novel method  providing  better accuracy than the Friedewald equation in the  estimation of LDL-C.  Cresenciano Genre et al. Annamaria Helling. 3212;248(25): 2061-2068  (http://education.[facility].com/faq/FAQ164)    Total CHOL/HDL Ratio 09/08/2021 3.6  <5.0 (calc) Final   Non-HDL Cholesterol (Calc) 09/08/2021 100  <130 mg/dL (calc) Final   Comment: For patients with diabetes plus 1 major ASCVD risk  factor, treating to a non-HDL-C goal of <100 mg/dL  (LDL-C of <70 mg/dL) is considered a therapeutic  option.     Past Medical History:  Diagnosis Date   ADD (attention deficit disorder)    Diabetes mellitus without complication ([place])    Hypercholesterolemia    Hypertension    Thyroid disorder    hypothyroidism   Past Surgical History:  Procedure Laterality Date   BREAST SURGERY     CERVIX SURGERY     EYE SURGERY     Current Outpatient Medications on File Prior to Visit  Medication Sig Dispense Refill   ALPRAZolam (XANAX) 0.5 MG tablet Take 1 tablet (0.5 mg total) by mouth 3 (three) times daily as needed for anxiety. 20 tablet 0   atorvastatin (LIPITOR) 20 MG tablet TAKE 1 TABLET BY MOUTH  DAILY 90 tablet 3   BD PEN NEEDLE NANO 2ND GEN 32G X 4 MM MISC USE DAILY WITH VICTOZA 100 each 3   hydrochlorothiazide (HYDRODIURIL) 25 MG tablet TAKE 1 TABLET BY MOUTH EVERY DAY 90 tablet 0   levothyroxine (SYNTHROID) 75 MCG tablet TAKE 1 TABLET BY MOUTH EVERY DAY 90 tablet 3   losartan (COZAAR) 50 MG tablet TAKE 1 TABLET BY MOUTH  DAILY 90 tablet 3   metFORMIN (GLUCOPHAGE) 1000 MG tablet TAKE 1 TABLET BY MOUTH  TWICE DAILY WITH MEALS 180 tablet 3   tirzepatide (MOUNJARO) 5 MG/0.5ML Pen Inject 5 mg into the skin once a week. 6 mL 3   VICTOZA 18 MG/3ML SOPN INJECT 1.2 MG UNDER THE SKIN ONCE DAILY 18 mL 5   No current facility-administered medications on file prior to visit.   No Known Allergies Social History   Socioeconomic History   Marital status: Married    Spouse name: Not on file   Number of children: Not on  file   Years of education: Not on file   Highest education level: Not on file  Occupational History   Not on file  Tobacco Use  Smoking status: Former   Smokeless tobacco: Never  Substance and Sexual Activity   Alcohol use: No   Drug use: No   Sexual activity: Not on file    Comment: married.  2 sons.  Currently, stay at home mom.  Other Topics Concern   Not on file  Social History Narrative   Not on file   Social Determinants of Health   Financial Resource Strain: Not on file  Food Insecurity: Not on file  Transportation Needs: Not on file  Physical Activity: Not on file  Stress: Not on file  Social Connections: Not on file  Intimate Partner Violence: Not on file      Review of Systems  All other systems reviewed and are negative.     Objective:   Physical Exam Vitals reviewed.  Constitutional:      General: She is not in acute distress.    Appearance: Normal appearance. She is obese. She is not ill-appearing or toxic-appearing.  HENT:     Head: Normocephalic and atraumatic.     Right Ear: Tympanic membrane and ear canal normal.     Left Ear: Tympanic membrane and ear canal normal.     Nose: Nose normal. No congestion or rhinorrhea.     Mouth/Throat:     Mouth: Mucous membranes are moist.     Pharynx: No oropharyngeal exudate or posterior oropharyngeal erythema.  Eyes:     General: No scleral icterus.       Right eye: No discharge.        Left eye: No discharge.     Extraocular Movements: Extraocular movements intact.     Pupils: Pupils are equal, round, and reactive to light.  Neck:     Vascular: No carotid bruit.  Cardiovascular:     Rate and Rhythm: Normal rate and regular rhythm.     Pulses: Normal pulses.     Heart sounds: Normal heart sounds. No murmur heard.   No friction rub. No gallop.  Pulmonary:     Effort: Pulmonary effort is normal. No respiratory distress.     Breath sounds: Normal breath sounds. No stridor. No wheezing, rhonchi or  rales.  Chest:     Chest wall: No tenderness.  Abdominal:     General: Abdomen is flat. There is no distension.     Palpations: Abdomen is soft. There is no mass.     Tenderness: There is no abdominal tenderness. There is no guarding or rebound.     Hernia: No hernia is present.  Musculoskeletal:     Cervical back: Normal range of motion and neck supple. No rigidity.     Right lower leg: No edema.     Left lower leg: No edema.  Lymphadenopathy:     Cervical: No cervical adenopathy.  Skin:    Coloration: Skin is not jaundiced or pale.     Findings: No bruising, erythema, lesion or rash.  Neurological:     General: No focal deficit present.     Mental Status: She is alert and oriented to person, place, and time. Mental status is at baseline.     Cranial Nerves: No cranial nerve deficit.     Motor: No weakness.     Gait: Gait normal.  Psychiatric:        Mood and Affect: Mood normal.        Behavior: Behavior normal.        Thought Content: Thought content normal.  Judgment: Judgment normal.           Assessment & Plan:  General medical exam  Diabetes mellitus without complication ([place])  Essential hypertension  Pure hypercholesterolemia  Breast cancer screening by mammogram I am very proud of the patient for losing weight.  I would like to push the dose to [facility] higher.  I would recommend increasing to [facility] by 2.5 mg.  She is not sure that she is taking 5 mg.  She believes she is on 7.5.  Therefore I want her to verify the dose before making changes.  Her blood pressure and her cholesterol are acceptable.  Her thyroid test is normal.  Her mammogram, colonoscopy, and Pap smear are up-to-date.  She had a diabetic eye exam earlier this year that was normal.  Diabetic foot exam was performed today and is normal.  The remainder of her preventative care is up-to-date.

## 2021-09-12 LAB — CBC WITH DIFFERENTIAL/PLATELET
Absolute Monocytes: 503 cells/uL (ref 200–950)
Basophils Absolute: 83 cells/uL (ref 0–200)
Basophils Relative: 1.1 %
Eosinophils Absolute: 203 cells/uL (ref 15–500)
Eosinophils Relative: 2.7 %
HCT: 41.1 % (ref 35.0–45.0)
Hemoglobin: 14.1 g/dL (ref 11.7–15.5)
Lymphs Abs: 2430 cells/uL (ref 850–3900)
MCH: 29.9 pg (ref 27.0–33.0)
MCHC: 34.3 g/dL (ref 32.0–36.0)
MCV: 87.3 fL (ref 80.0–100.0)
MPV: 11 fL (ref 7.5–12.5)
Monocytes Relative: 6.7 %
Neutro Abs: 4283 cells/uL (ref 1500–7800)
Neutrophils Relative %: 57.1 %
Platelets: 236 10*3/uL (ref 140–400)
RBC: 4.71 10*6/uL (ref 3.80–5.10)
RDW: 12.2 % (ref 11.0–15.0)
Total Lymphocyte: 32.4 %
WBC: 7.5 10*3/uL (ref 3.8–10.8)

## 2021-09-12 LAB — TEST AUTHORIZATION

## 2021-09-12 LAB — COMPREHENSIVE METABOLIC PANEL
AG Ratio: 1.7 (calc) (ref 1.0–2.5)
ALT: 40 U/L — ABNORMAL HIGH (ref 6–29)
AST: 26 U/L (ref 10–35)
Albumin: 4.6 g/dL (ref 3.6–5.1)
Alkaline phosphatase (APISO): 69 U/L (ref 37–153)
BUN: 15 mg/dL (ref 7–25)
CO2: 29 mmol/L (ref 20–32)
Calcium: 10.1 mg/dL (ref 8.6–10.4)
Chloride: 100 mmol/L (ref 98–110)
Creat: 0.83 mg/dL (ref 0.50–1.03)
Globulin: 2.7 g/dL (calc) (ref 1.9–3.7)
Glucose, Bld: 117 mg/dL — ABNORMAL HIGH (ref 65–99)
Potassium: 3.9 mmol/L (ref 3.5–5.3)
Sodium: 140 mmol/L (ref 135–146)
Total Bilirubin: 0.7 mg/dL (ref 0.2–1.2)
Total Protein: 7.3 g/dL (ref 6.1–8.1)

## 2021-09-12 LAB — LIPID PANEL
Cholesterol: 139 mg/dL (ref <200)
HDL: 39 mg/dL — ABNORMAL LOW (ref >=50)
LDL Cholesterol (Calc): 69 mg/dL (calc)
Non-HDL Cholesterol (Calc): 100 mg/dL (calc) (ref <130)
Total CHOL/HDL Ratio: 3.6 (calc) (ref <5.0)
Triglycerides: 215 mg/dL — ABNORMAL HIGH (ref <150)

## 2021-09-12 LAB — TSH: TSH: 4.06 mIU/L (ref 0.40–4.50)

## 2021-09-12 LAB — HEMOGLOBIN A1C W/OUT EAG: Hgb A1c MFr Bld: 6.2 % of total Hgb — ABNORMAL HIGH (ref <5.7)

## 2021-10-15 ENCOUNTER — Encounter: Payer: Self-pay | Admitting: Family Medicine

## 2021-11-02 ENCOUNTER — Other Ambulatory Visit: Payer: Self-pay | Admitting: Family Medicine

## 2021-11-02 MED ORDER — TIRZEPATIDE 10 MG/0.5ML ~~LOC~~ SOAJ
10.0000 mg | SUBCUTANEOUS | 1 refills | Status: DC
  Filled 2021-11-06: qty 2, 28d supply, fill #0
  Filled 2021-12-07: qty 2, 28d supply, fill #1
  Filled 2022-01-02: qty 2, 28d supply, fill #2
  Filled 2022-02-07: qty 2, 28d supply, fill #3
  Filled 2022-02-18 – 2022-03-04 (×2): qty 2, 28d supply, fill #4
  Filled 2022-03-31: qty 2, 28d supply, fill #5

## 2021-11-03 ENCOUNTER — Other Ambulatory Visit: Payer: Self-pay

## 2021-11-03 NOTE — Telephone Encounter (Signed)
Pharmacy faxed a refill request for hydrochlorothiazide (HYDRODIURIL) 25 MG tablet [[id number]]    Order Details Dose, Route, Frequency: As Directed  Dispense Quantity: 90 tablet Refills: 0        Sig: TAKE 1 TABLET BY MOUTH EVERY DAY       Start Date: 06/29/21 End Date: --  Written Date: 06/29/21 Expiration Date: 06/29/22

## 2021-11-04 MED ORDER — HYDROCHLOROTHIAZIDE 25 MG PO TABS: 25.0000 mg | ORAL_TABLET | Freq: Every day | ORAL | 1 refills | Status: DC

## 2021-11-04 NOTE — Telephone Encounter (Signed)
Requested Prescriptions  Pending Prescriptions Disp Refills  . hydrochlorothiazide (HYDRODIURIL) 25 MG tablet 90 tablet 1    Sig: Take 1 tablet (25 mg total) by mouth daily.     Cardiovascular: Diuretics - Thiazide Failed - 11/03/2021  2:59 PM      Failed - Valid encounter within last 6 months    Recent Outpatient Visits          1 month ago General medical exam   [place] Susy Frizzle, MD   8 months ago Need for immunization against influenza   [place] Susy Frizzle, MD   1 year ago Subcutaneous mass of right forearm   [place] [facility]enn[facility] Schaumann, Cammie Mcgee, MD   1 year ago General medical exam   [place] Susy Frizzle, MD   1 year ago Need for immunization against influenza   [place], Warren T, MD      Future Appointments            In 4 months Pickard, Cammie Mcgee, MD [place]           Passed - Cr in normal range and within 180 days    Creat  Date Value Ref Range Status  09/08/2021 0.83 0.50 - 1.03 mg/dL Final   Creatinine, Urine  Date Value Ref Range Status  09/03/2020 112 20 - 275 mg/dL Final         Passed - K in normal range and within 180 days    Potassium  Date Value Ref Range Status  09/08/2021 3.9 3.5 - 5.3 mmol/L Final         Passed - Na in normal range and within 180 days    Sodium  Date Value Ref Range Status  09/08/2021 140 135 - 146 mmol/L Final         Passed - Last BP in normal range    BP Readings from Last 1 Encounters:  09/10/21 118/82

## 2021-11-06 ENCOUNTER — Other Ambulatory Visit (HOSPITAL_COMMUNITY): Payer: Self-pay

## 2021-11-26 ENCOUNTER — Other Ambulatory Visit: Payer: Self-pay | Admitting: Family Medicine

## 2021-11-26 NOTE — Telephone Encounter (Signed)
Requested Prescriptions  Pending Prescriptions Disp Refills  . levothyroxine (SYNTHROID) 75 MCG tablet [Pharmacy Med Name: LEVOTHYROXINE 75 MCG TABLET] 90 tablet 3    Sig: TAKE 1 TABLET BY MOUTH EVERY DAY     Endocrinology:  Hypothyroid Agents Passed - 11/26/2021  2:37 AM      Passed - TSH in normal range and within 360 days    TSH  Date Value Ref Range Status  09/08/2021 4.06 0.40 - 4.50 mIU/L Final         Passed - Valid encounter within last 12 months    Recent Outpatient Visits          2 months ago General medical exam   [place] Susy Frizzle, MD   8 months ago Need for immunization against influenza   [place] Dennard Schaumann Cammie Mcgee, MD   1 year ago Subcutaneous mass of right forearm   [place] Pickard, Cammie Mcgee, MD   1 year ago General medical exam   [place] Susy Frizzle, MD   1 year ago Need for immunization against influenza   [place], Cammie Mcgee, MD      Future Appointments            In 4 months Pickard, Cammie Mcgee, MD [place]

## 2021-12-08 ENCOUNTER — Other Ambulatory Visit (HOSPITAL_COMMUNITY): Payer: Self-pay

## 2021-12-23 ENCOUNTER — Encounter: Payer: Self-pay | Admitting: Family Medicine

## 2021-12-25 ENCOUNTER — Other Ambulatory Visit: Payer: Self-pay | Admitting: Family Medicine

## 2021-12-25 MED ORDER — ONDANSETRON HCL 4 MG PO TABS: 4.0000 mg | ORAL_TABLET | Freq: Three times a day (TID) | ORAL | 0 refills | Status: DC | PRN

## 2022-01-02 ENCOUNTER — Other Ambulatory Visit (HOSPITAL_COMMUNITY): Payer: Self-pay

## 2022-01-19 ENCOUNTER — Encounter: Payer: Self-pay | Admitting: Family Medicine

## 2022-01-23 ENCOUNTER — Other Ambulatory Visit: Payer: Self-pay | Admitting: Family Medicine

## 2022-02-08 ENCOUNTER — Other Ambulatory Visit (HOSPITAL_COMMUNITY): Payer: Self-pay

## 2022-02-18 ENCOUNTER — Other Ambulatory Visit (HOSPITAL_COMMUNITY): Payer: Self-pay

## 2022-02-27 ENCOUNTER — Other Ambulatory Visit (HOSPITAL_COMMUNITY): Payer: Self-pay

## 2022-03-02 ENCOUNTER — Ambulatory Visit: Payer: PRIVATE HEALTH INSURANCE

## 2022-03-04 ENCOUNTER — Other Ambulatory Visit (HOSPITAL_COMMUNITY): Payer: Self-pay

## 2022-03-05 ENCOUNTER — Ambulatory Visit: Payer: PRIVATE HEALTH INSURANCE

## 2022-03-10 ENCOUNTER — Ambulatory Visit (INDEPENDENT_AMBULATORY_CARE_PROVIDER_SITE_OTHER): Payer: PRIVATE HEALTH INSURANCE

## 2022-03-10 DIAGNOSIS — Z23 Encounter for immunization: Secondary | ICD-10-CM | POA: Diagnosis not present

## 2022-03-26 ENCOUNTER — Ambulatory Visit: Payer: Self-pay | Admitting: Family Medicine

## 2022-04-02 ENCOUNTER — Ambulatory Visit: Payer: PRIVATE HEALTH INSURANCE | Admitting: Family Medicine

## 2022-04-06 ENCOUNTER — Other Ambulatory Visit (HOSPITAL_COMMUNITY): Payer: Self-pay

## 2022-04-09 ENCOUNTER — Ambulatory Visit (INDEPENDENT_AMBULATORY_CARE_PROVIDER_SITE_OTHER): Payer: PRIVATE HEALTH INSURANCE | Admitting: Family Medicine

## 2022-04-09 ENCOUNTER — Encounter: Payer: Self-pay | Admitting: Family Medicine

## 2022-04-09 VITALS — BP 132/68 | HR 91 | Ht 63.0 in | Wt 164.0 lb

## 2022-04-09 DIAGNOSIS — E119 Type 2 diabetes mellitus without complications: Secondary | ICD-10-CM

## 2022-04-09 DIAGNOSIS — E7800 Pure hypercholesterolemia, unspecified: Secondary | ICD-10-CM | POA: Diagnosis not present

## 2022-04-09 DIAGNOSIS — E039 Hypothyroidism, unspecified: Secondary | ICD-10-CM

## 2022-04-09 DIAGNOSIS — I10 Essential (primary) hypertension: Secondary | ICD-10-CM

## 2022-04-09 NOTE — Progress Notes (Signed)
Subjective:    Patient ID: Samantha Glover, female    DOB: 12-06-1965, 56 y.o.   MRN: [id number]     Wt Readings from Last 3 Encounters:  04/09/22 164 lb (74.4 kg)  09/10/21 188 lb 12.8 oz (85.6 kg)  03/09/21 188 lb (85.3 kg)   Patient has lost significant weight [place].  She was over 200 pounds when she started.  She is lost 24 pounds since May.  She was originally having nausea however that has improved and she no longer is experiencing any nausea.  She denies any abdominal pain or constipation or diarrhea.  She denies any hypoglycemic episodes.  She denies any chest pain or shortness of breath.  She does have some excess skin.  She is not focusing on any toning exercises.  She is not using any resistance training.  She denies any palpitations or hot flashes on Synthroid.  She denies any neuropathy in her feet Past Medical History:  Diagnosis Date   ADD (attention deficit disorder)    Diabetes mellitus without complication ([place])    Hypercholesterolemia    Hypertension    Thyroid disorder    hypothyroidism   Past Surgical History:  Procedure Laterality Date   BREAST SURGERY     CERVIX SURGERY     EYE SURGERY     Current Outpatient Medications on File Prior to Visit  Medication Sig Dispense Refill   atorvastatin (LIPITOR) 20 MG tablet TAKE 1 TABLET BY MOUTH  DAILY 90 tablet 3   hydrochlorothiazide (HYDRODIURIL) 25 MG tablet Take 1 tablet (25 mg total) by mouth daily. 90 tablet 1   levothyroxine (SYNTHROID) 75 MCG tablet TAKE 1 TABLET BY MOUTH EVERY DAY 90 tablet 3   losartan (COZAAR) 50 MG tablet TAKE 1 TABLET BY MOUTH  DAILY 90 tablet 3   metFORMIN (GLUCOPHAGE) 1000 MG tablet TAKE 1 TABLET BY MOUTH TWICE  DAILY WITH MEALS 180 tablet 3   ondansetron (ZOFRAN) 4 MG tablet Take 1 tablet (4 mg total) by mouth every 8 (eight) hours as needed for nausea or vomiting. 20 tablet 0   tirzepatide (MOUNJARO) 10 MG/0.5ML Pen Inject 1 pen (10 mg) into the skin once a week. 6 mL 1   No  current facility-administered medications on file prior to visit.   No Known Allergies Social History   Socioeconomic History   Marital status: Married    Spouse name: Not on file   Number of children: Not on file   Years of education: Not on file   Highest education level: Not on file  Occupational History   Not on file  Tobacco Use   Smoking status: Former   Smokeless tobacco: Never  Substance and Sexual Activity   Alcohol use: No   Drug use: No   Sexual activity: Not on file    Comment: married.  2 sons.  Currently, stay at home mom.  Other Topics Concern   Not on file  Social History Narrative   Not on file   Social Determinants of Health   Financial Resource Strain: Not on file  Food Insecurity: Not on file  Transportation Needs: Not on file  Physical Activity: Not on file  Stress: Not on file  Social Connections: Not on file  Intimate Partner Violence: Not on file      Review of Systems  All other systems reviewed and are negative.      Objective:   Physical Exam Vitals reviewed.  Constitutional:  General: She is not in acute distress.    Appearance: Normal appearance. She is obese. She is not ill-appearing or toxic-appearing.  HENT:     Head: Normocephalic and atraumatic.     Right Ear: Tympanic membrane and ear canal normal.     Left Ear: Tympanic membrane and ear canal normal.     Nose: Nose normal. No congestion or rhinorrhea.     Mouth/Throat:     Mouth: Mucous membranes are moist.     Pharynx: No oropharyngeal exudate or posterior oropharyngeal erythema.  Eyes:     General: No scleral icterus.       Right eye: No discharge.        Left eye: No discharge.     Extraocular Movements: Extraocular movements intact.     Pupils: Pupils are equal, round, and reactive to light.  Neck:     Vascular: No carotid bruit.  Cardiovascular:     Rate and Rhythm: Normal rate and regular rhythm.     Pulses: Normal pulses.     Heart sounds: Normal heart  sounds. No murmur heard.    No friction rub. No gallop.  Pulmonary:     Effort: Pulmonary effort is normal. No respiratory distress.     Breath sounds: Normal breath sounds. No stridor. No wheezing, rhonchi or rales.  Chest:     Chest wall: No tenderness.  Abdominal:     General: Abdomen is flat. There is no distension.     Palpations: Abdomen is soft. There is no mass.     Tenderness: There is no abdominal tenderness. There is no guarding or rebound.     Hernia: No hernia is present.  Musculoskeletal:     Cervical back: Normal range of motion and neck supple. No rigidity.     Right lower leg: No edema.     Left lower leg: No edema.  Lymphadenopathy:     Cervical: No cervical adenopathy.  Skin:    Coloration: Skin is not jaundiced or pale.     Findings: No bruising, erythema, lesion or rash.  Neurological:     General: No focal deficit present.     Mental Status: She is alert and oriented to person, place, and time. Mental status is at baseline.     Cranial Nerves: No cranial nerve deficit.     Motor: No weakness.     Gait: Gait normal.  Psychiatric:        Mood and Affect: Mood normal.        Behavior: Behavior normal.        Thought Content: Thought content normal.        Judgment: Judgment normal.            Assessment & Plan:  Diabetes mellitus without complication ([place]) - Plan: Hemoglobin A1c, COMPLETE METABOLIC PANEL WITH GFR, Lipid panel, Protein / Creatinine Ratio, Urine  Essential hypertension  Pure hypercholesterolemia  Hypothyroidism, unspecified type - Plan: TSH I am very proud of the patient for losing weight.  I recommend that she start weight training or resistance training to try to tighten the muscles of the body to help with some of the excess skin.  This is a recommended curls and extensions to try to tone her biceps and triceps.  I recommended knee extension and knee flexion exercises with weight to try to tone her hips and her hamstrings and quads.   She can even use a stationary bike to try to tone her leg muscles.  Her blood pressure today is outstanding.  If her A1c is less than 6 we will discontinue metformin.  We will continue Mounjaro at the current dose.  I would like to recheck her levothyroxine given the weight loss to make sure were not giving her too much.

## 2022-04-10 LAB — LIPID PANEL
Cholesterol: 139 mg/dL (ref <200)
HDL: 39 mg/dL — ABNORMAL LOW (ref >=50)
LDL Cholesterol (Calc): 74 mg/dL (calc)
Non-HDL Cholesterol (Calc): 100 mg/dL (calc) (ref <130)
Total CHOL/HDL Ratio: 3.6 (calc) (ref <5.0)
Triglycerides: 159 mg/dL — ABNORMAL HIGH (ref <150)

## 2022-04-10 LAB — COMPLETE METABOLIC PANEL WITH GFR
AG Ratio: 1.6 (calc) (ref 1.0–2.5)
ALT: 13 U/L (ref 6–29)
AST: 14 U/L (ref 10–35)
Albumin: 4.6 g/dL (ref 3.6–5.1)
Alkaline phosphatase (APISO): 70 U/L (ref 37–153)
BUN: 14 mg/dL (ref 7–25)
CO2: 28 mmol/L (ref 20–32)
Calcium: 10.3 mg/dL (ref 8.6–10.4)
Chloride: 100 mmol/L (ref 98–110)
Creat: 0.91 mg/dL (ref 0.50–1.03)
Globulin: 2.9 g/dL (calc) (ref 1.9–3.7)
Glucose, Bld: 88 mg/dL (ref 65–99)
Potassium: 4.1 mmol/L (ref 3.5–5.3)
Sodium: 141 mmol/L (ref 135–146)
Total Bilirubin: 0.8 mg/dL (ref 0.2–1.2)
Total Protein: 7.5 g/dL (ref 6.1–8.1)
eGFR: 74 mL/min/{1.73_m2} (ref >=60)

## 2022-04-10 LAB — HEMOGLOBIN A1C
Hgb A1c MFr Bld: 5.4 % of total Hgb (ref <5.7)
Mean Plasma Glucose: 108 mg/dL
eAG (mmol/L): 6 mmol/L

## 2022-04-10 LAB — TSH: TSH: 8.98 mIU/L — ABNORMAL HIGH (ref 0.40–4.50)

## 2022-04-10 LAB — PROTEIN / CREATININE RATIO, URINE
Creatinine, Urine: 152 mg/dL (ref 20–275)
Protein/Creat Ratio: 53 mg/g creat (ref 24–184)
Protein/Creatinine Ratio: 0.053 mg/mg creat (ref 0.024–0.184)
Total Protein, Urine: 8 mg/dL (ref 5–24)

## 2022-04-12 ENCOUNTER — Other Ambulatory Visit: Payer: Self-pay

## 2022-04-12 DIAGNOSIS — E039 Hypothyroidism, unspecified: Secondary | ICD-10-CM

## 2022-04-12 MED ORDER — LEVOTHYROXINE SODIUM 88 MCG PO TABS: 88.0000 ug | ORAL_TABLET | Freq: Every day | ORAL | 3 refills | Status: DC

## 2022-05-03 ENCOUNTER — Other Ambulatory Visit: Payer: Self-pay | Admitting: Family Medicine

## 2022-05-04 ENCOUNTER — Other Ambulatory Visit (HOSPITAL_COMMUNITY): Payer: Self-pay

## 2022-05-05 ENCOUNTER — Other Ambulatory Visit (HOSPITAL_COMMUNITY): Payer: Self-pay

## 2022-05-06 ENCOUNTER — Other Ambulatory Visit: Payer: Self-pay | Admitting: Family Medicine

## 2022-05-06 ENCOUNTER — Other Ambulatory Visit (HOSPITAL_COMMUNITY): Payer: Self-pay

## 2022-05-06 ENCOUNTER — Other Ambulatory Visit: Payer: Self-pay

## 2022-05-06 MED ORDER — MOUNJARO 10 MG/0.5ML ~~LOC~~ SOAJ: 10.0000 mg | SUBCUTANEOUS | 1 refills | Status: DC

## 2022-05-21 ENCOUNTER — Other Ambulatory Visit: Payer: Self-pay | Admitting: Family Medicine

## 2022-05-21 ENCOUNTER — Telehealth: Payer: Self-pay

## 2022-05-21 MED ORDER — NIRMATRELVIR/RITONAVIR (PAXLOVID)TABLET: 3.0000 | ORAL_TABLET | Freq: Two times a day (BID) | ORAL | 0 refills | Status: AC

## 2022-05-21 NOTE — Telephone Encounter (Signed)
Pt called stated that she just tested now for Covid, it as positive. Would like if you can send Rx to [facility]

## 2022-05-21 NOTE — Telephone Encounter (Signed)
Called pt, aware abx for covid has been sent into pt's pharmacy.  Nothing further.

## 2022-06-01 ENCOUNTER — Other Ambulatory Visit: Payer: Self-pay | Admitting: Family Medicine

## 2022-06-07 ENCOUNTER — Other Ambulatory Visit: Payer: Self-pay | Admitting: Family Medicine

## 2022-06-07 DIAGNOSIS — Z1231 Encounter for screening mammogram for malignant neoplasm of breast: Secondary | ICD-10-CM

## 2022-06-09 ENCOUNTER — Other Ambulatory Visit: Payer: PRIVATE HEALTH INSURANCE

## 2022-06-09 DIAGNOSIS — E119 Type 2 diabetes mellitus without complications: Secondary | ICD-10-CM

## 2022-06-09 DIAGNOSIS — E079 Disorder of thyroid, unspecified: Secondary | ICD-10-CM

## 2022-06-09 DIAGNOSIS — I10 Essential (primary) hypertension: Secondary | ICD-10-CM

## 2022-06-10 LAB — CBC WITH DIFFERENTIAL/PLATELET
Absolute Monocytes: 493 cells/uL (ref 200–950)
Basophils Absolute: 47 cells/uL (ref 0–200)
Basophils Relative: 0.5 %
Eosinophils Absolute: 223 cells/uL (ref 15–500)
Eosinophils Relative: 2.4 %
HCT: 41.4 % (ref 35.0–45.0)
Hemoglobin: 14.3 g/dL (ref 11.7–15.5)
Lymphs Abs: 3246 cells/uL (ref 850–3900)
MCH: 29.6 pg (ref 27.0–33.0)
MCHC: 34.5 g/dL (ref 32.0–36.0)
MCV: 85.7 fL (ref 80.0–100.0)
MPV: 10.9 fL (ref 7.5–12.5)
Monocytes Relative: 5.3 %
Neutro Abs: 5292 cells/uL (ref 1500–7800)
Neutrophils Relative %: 56.9 %
Platelets: 247 10*3/uL (ref 140–400)
RBC: 4.83 10*6/uL (ref 3.80–5.10)
RDW: 12.5 % (ref 11.0–15.0)
Total Lymphocyte: 34.9 %
WBC: 9.3 10*3/uL (ref 3.8–10.8)

## 2022-06-10 LAB — COMPREHENSIVE METABOLIC PANEL
AG Ratio: 1.6 (calc) (ref 1.0–2.5)
ALT: 16 U/L (ref 6–29)
AST: 15 U/L (ref 10–35)
Albumin: 4.5 g/dL (ref 3.6–5.1)
Alkaline phosphatase (APISO): 65 U/L (ref 37–153)
BUN: 12 mg/dL (ref 7–25)
CO2: 31 mmol/L (ref 20–32)
Calcium: 10.1 mg/dL (ref 8.6–10.4)
Chloride: 99 mmol/L (ref 98–110)
Creat: 0.82 mg/dL (ref 0.50–1.03)
Globulin: 2.8 g/dL (calc) (ref 1.9–3.7)
Glucose, Bld: 88 mg/dL (ref 65–99)
Potassium: 3.8 mmol/L (ref 3.5–5.3)
Sodium: 141 mmol/L (ref 135–146)
Total Bilirubin: 0.5 mg/dL (ref 0.2–1.2)
Total Protein: 7.3 g/dL (ref 6.1–8.1)

## 2022-06-10 LAB — TSH: TSH: 3.89 mIU/L (ref 0.40–4.50)

## 2022-06-25 ENCOUNTER — Other Ambulatory Visit: Payer: Self-pay | Admitting: Family Medicine

## 2022-06-25 DIAGNOSIS — Z1231 Encounter for screening mammogram for malignant neoplasm of breast: Secondary | ICD-10-CM

## 2022-06-27 ENCOUNTER — Other Ambulatory Visit: Payer: Self-pay | Admitting: Family Medicine

## 2022-07-14 IMAGING — US US EXTREM UP *R* LTD
1 series · 7 of 7 positions shown · non-contrast
Comparison: None.

CLINICAL DATA: Subcutaneous mass

EXAM:
ULTRASOUND right UPPER EXTREMITY LIMITED
TECHNIQUE: Ultrasound examination of the upper extremity soft tissues was
performed in the area of clinical concern.

[Series 1: us extrem up *right* ltd · 0.06mm/px · 7 acquisitions, 7 frames shown]
[im 1/7]
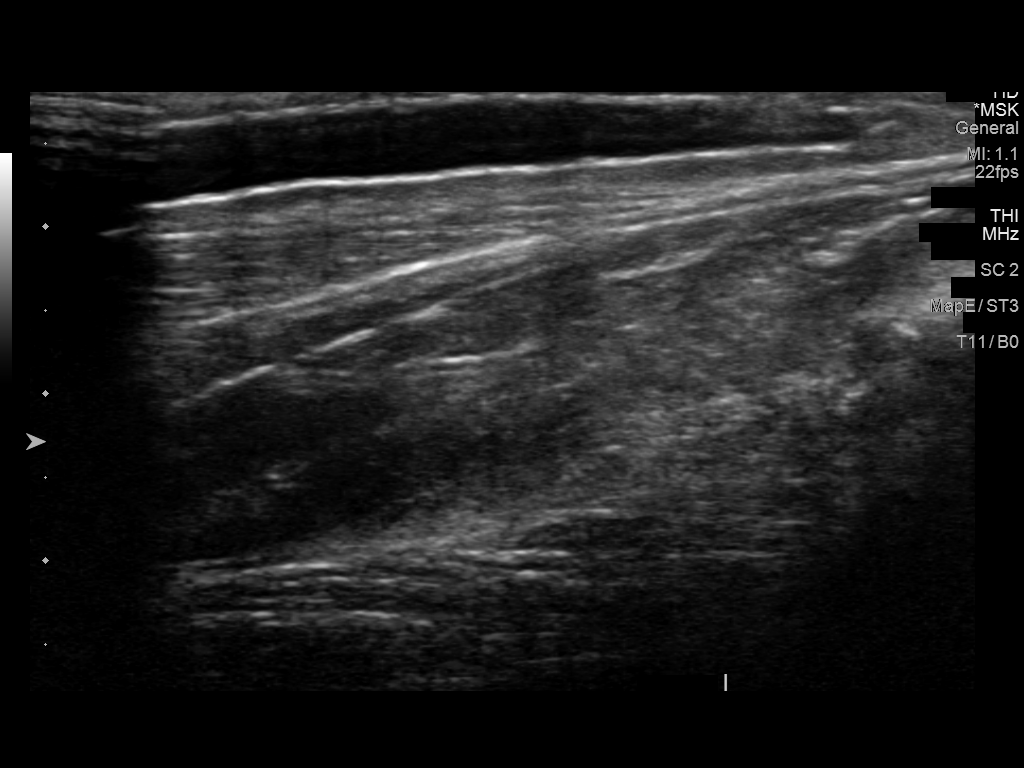
[im 2/7]
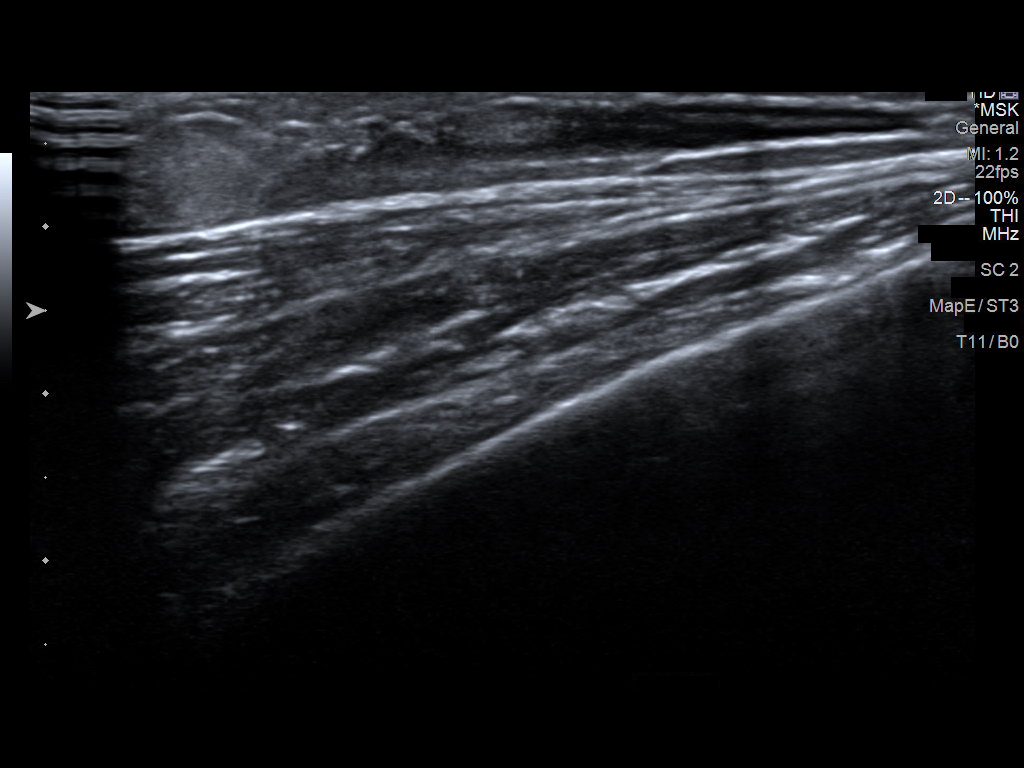
[im 3/7]
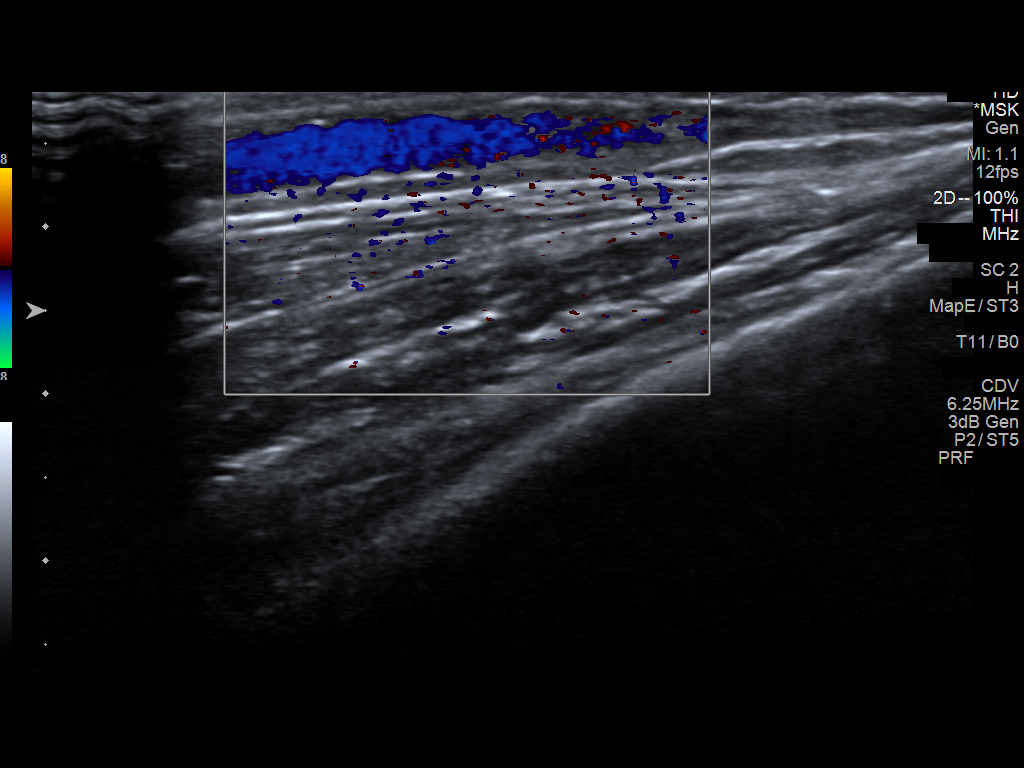
[im 4/7]
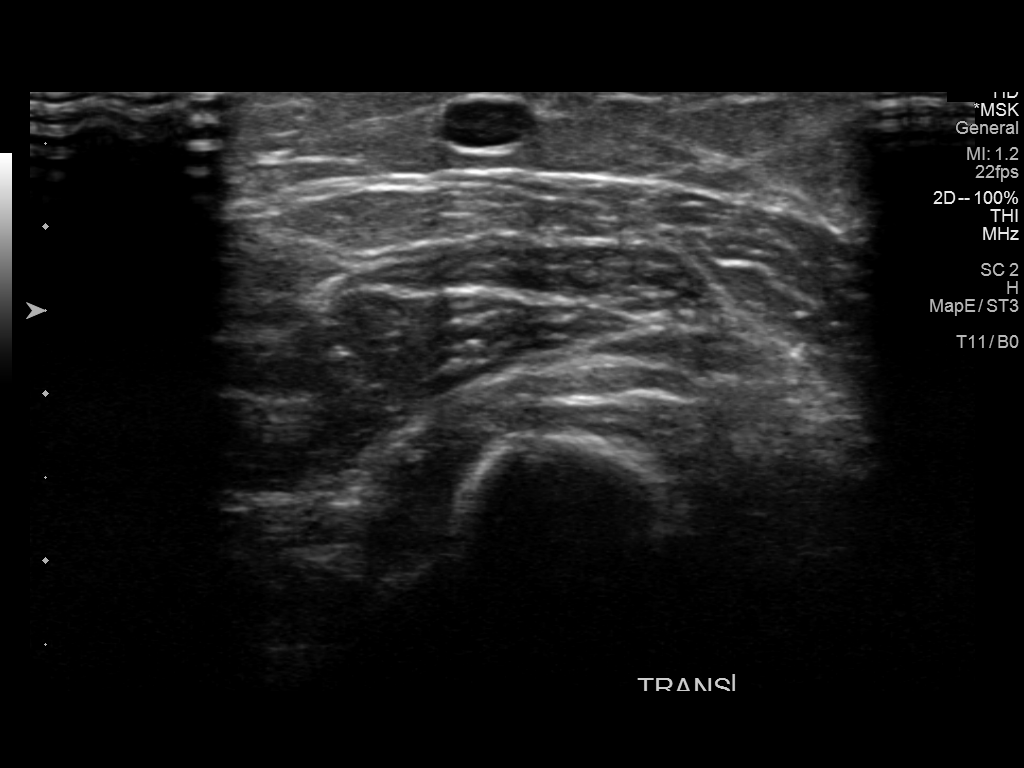
[im 5/7]
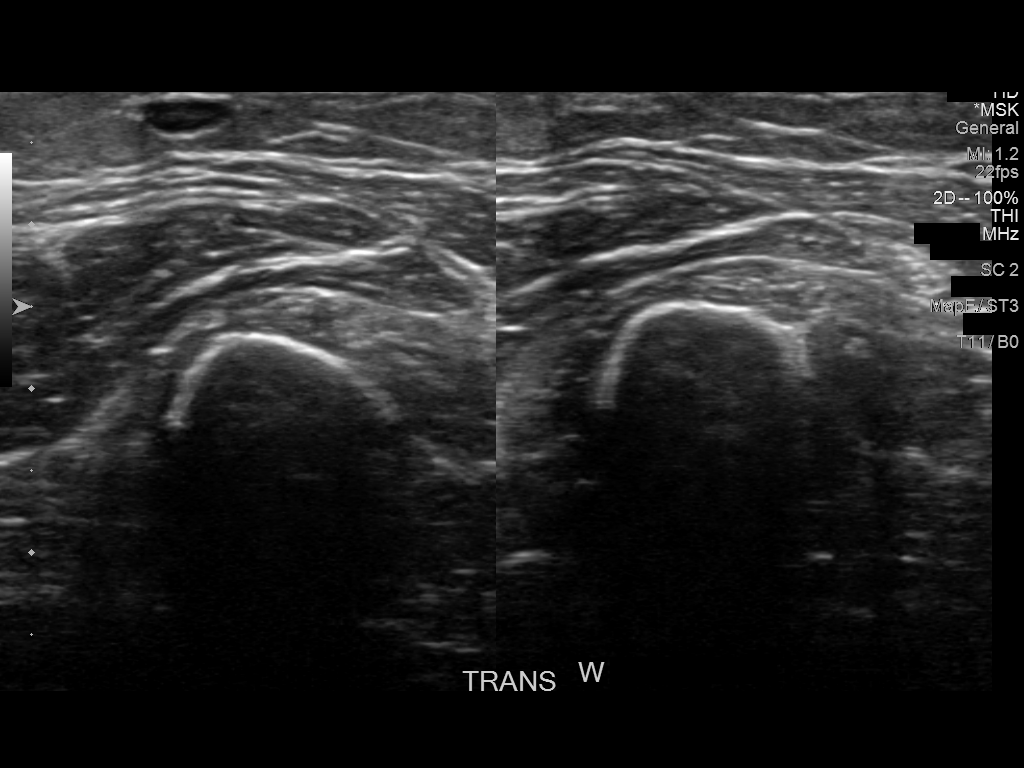
[im 6/7]
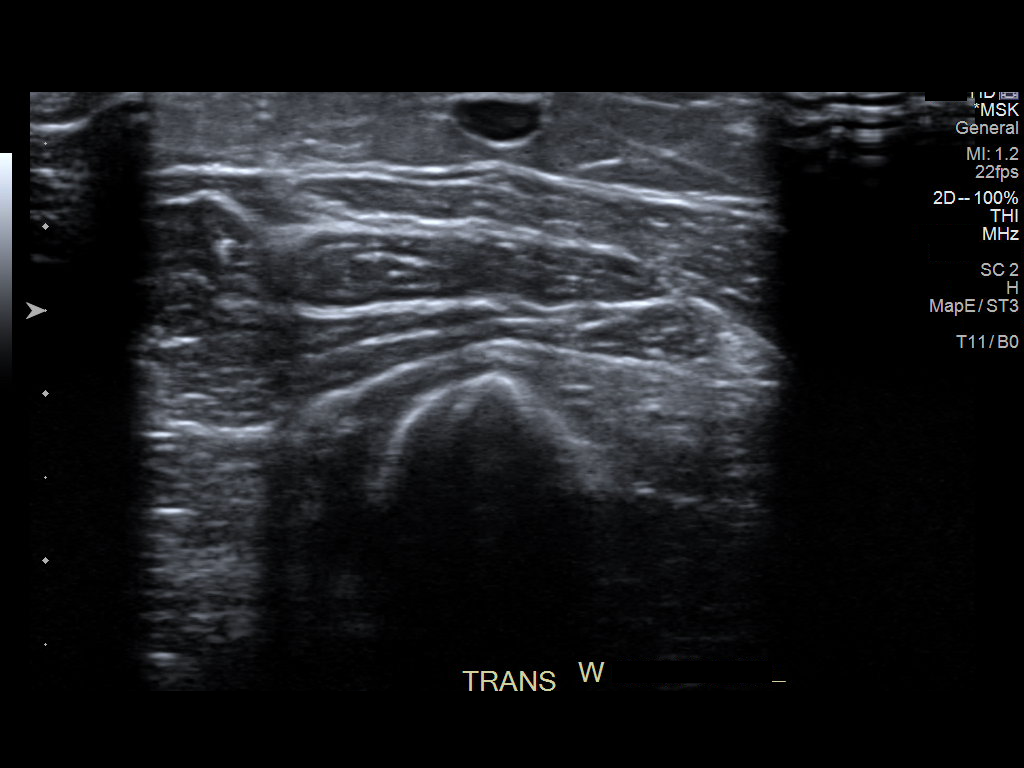
[im 7/7]
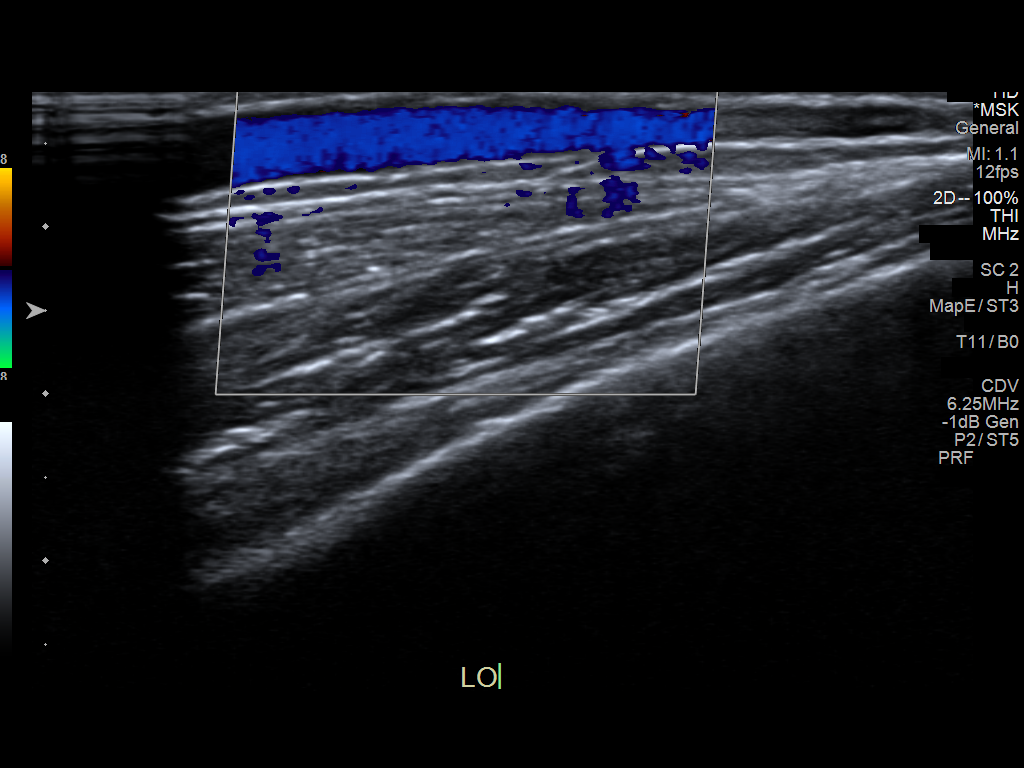

[7 of 7 positions shown; findings below may reference images not displayed]

FINDINGS: In the palpable area of concern, there is a superficial vein which
demonstrates normal color flow and normal compressibility. There is
no cystic or solid mass. There is no soft tissue edema or
inflammatory change. Otherwise normal subcutaneous tissue and
underlying muscles.
IMPRESSION: Prominent superficial vein in the palpable area of concern, which
demonstrates normal flow and compressibility.

No evidence of cystic or solid mass within the field of view.

## 2022-07-15 ENCOUNTER — Telehealth: Payer: Self-pay | Admitting: Internal Medicine

## 2022-07-15 NOTE — Telephone Encounter (Signed)
Good Morning Dr Hilarie Fredrickson   We have received a request from patient wanting to transfer care from [facility] to you due to her husband being a patient of yours and also for insurance purposes as well. Patient is wanting to have a colon procedure.  I am sending records for review please review and advise on scheduling.  Thank you

## 2022-07-16 NOTE — Telephone Encounter (Signed)
PT is calling to get an update on transfer of care.

## 2022-07-24 ENCOUNTER — Other Ambulatory Visit: Payer: Self-pay | Admitting: Family Medicine

## 2022-07-24 NOTE — Telephone Encounter (Signed)
I have not been in the office for over a week due to vacation and hospital duty  Do we have previous records or is this the patient's initial screening colonoscopy?  I do not see a problem accepting the patient but I need to understand the reason for colonoscopy

## 2022-07-26 NOTE — Telephone Encounter (Signed)
Requested Prescriptions  Pending Prescriptions Disp Refills   atorvastatin (LIPITOR) 20 MG tablet [Pharmacy Med Name: Atorvastatin Calcium 20 MG Oral Tablet] 90 tablet 2    Sig: TAKE 1 TABLET BY MOUTH ONCE  DAILY     Cardiovascular:  Antilipid - Statins Failed - 07/24/2022 10:20 PM      Failed - Valid encounter within last 12 months    Recent Outpatient Visits           10 months ago General medical exam   [place] Susy Frizzle, MD   1 year ago Need for immunization against influenza   [place] Susy Frizzle, MD   1 year ago Subcutaneous mass of right forearm   [place] Pickard, Cammie Mcgee, MD   1 year ago General medical exam   [place] Susy Frizzle, MD   2 years ago Need for immunization against influenza   [place], Warren T, MD              Failed - Lipid Panel in normal range within the last 12 months    Cholesterol  Date Value Ref Range Status  04/09/2022 139 <200 mg/dL Final   LDL Cholesterol (Calc)  Date Value Ref Range Status  04/09/2022 74 mg/dL (calc) Final    Comment:    Reference range: <100 . Desirable range <100 mg/dL for primary prevention;   <70 mg/dL for patients with CHD or diabetic patients  with > or = 2 CHD risk factors. Marland Kitchen LDL-C is now calculated using the Martin-Hopkins  calculation, which is a validated novel method providing  better accuracy than the Friedewald equation in the  estimation of LDL-C.  Cresenciano Genre et al. Annamaria Helling. [id number]): 2061-2068  (http://education.[facility].com/faq/FAQ164)    HDL  Date Value Ref Range Status  04/09/2022 39 (L) > OR = 50 mg/dL Final   Triglycerides  Date Value Ref Range Status  04/09/2022 159 (H) <150 mg/dL Final         Passed - Patient is not pregnant

## 2022-07-28 NOTE — Telephone Encounter (Signed)
Records reviewed, last colonoscopy 5 years ago at [place] history of colon polyps Records to be scanned Okay for [place] colonoscopy with me, direct via previsit

## 2022-07-28 NOTE — Telephone Encounter (Signed)
Patient last Colonoscopy was 5 years ago,would be due this year sometime.Reason for the request is due to her husband being your patient and Insurance.She stated she is currently not having any issues .

## 2022-07-29 ENCOUNTER — Encounter: Payer: Self-pay | Admitting: Family Medicine

## 2022-07-29 ENCOUNTER — Encounter: Payer: Self-pay | Admitting: Internal Medicine

## 2022-07-29 NOTE — Telephone Encounter (Signed)
PT scheduled- PV 52 6/26 1pm; colonoscopy 7/26 930am

## 2022-09-29 ENCOUNTER — Other Ambulatory Visit: Payer: PRIVATE HEALTH INSURANCE

## 2022-09-29 DIAGNOSIS — E7800 Pure hypercholesterolemia, unspecified: Secondary | ICD-10-CM

## 2022-09-29 DIAGNOSIS — E119 Type 2 diabetes mellitus without complications: Secondary | ICD-10-CM

## 2022-09-29 DIAGNOSIS — I10 Essential (primary) hypertension: Secondary | ICD-10-CM

## 2022-09-29 DIAGNOSIS — E079 Disorder of thyroid, unspecified: Secondary | ICD-10-CM

## 2022-09-30 LAB — CBC WITH DIFFERENTIAL/PLATELET
Absolute Monocytes: 416 cells/uL (ref 200–950)
Basophils Absolute: 51 cells/uL (ref 0–200)
Basophils Relative: 0.7 %
Eosinophils Absolute: 168 cells/uL (ref 15–500)
Eosinophils Relative: 2.3 %
HCT: 43.2 % (ref 35.0–45.0)
Hemoglobin: 14.5 g/dL (ref 11.7–15.5)
Lymphs Abs: 2540 cells/uL (ref 850–3900)
MCH: 29.8 pg (ref 27.0–33.0)
MCHC: 33.6 g/dL (ref 32.0–36.0)
MCV: 88.9 fL (ref 80.0–100.0)
MPV: 10.6 fL (ref 7.5–12.5)
Monocytes Relative: 5.7 %
Neutro Abs: 4125 cells/uL (ref 1500–7800)
Neutrophils Relative %: 56.5 %
Platelets: 255 10*3/uL (ref 140–400)
RBC: 4.86 10*6/uL (ref 3.80–5.10)
RDW: 12 % (ref 11.0–15.0)
Total Lymphocyte: 34.8 %
WBC: 7.3 10*3/uL (ref 3.8–10.8)

## 2022-09-30 LAB — COMPLETE METABOLIC PANEL WITH GFR
AG Ratio: 1.6 (calc) (ref 1.0–2.5)
ALT: 13 U/L (ref 6–29)
AST: 15 U/L (ref 10–35)
Albumin: 4.5 g/dL (ref 3.6–5.1)
Alkaline phosphatase (APISO): 67 U/L (ref 37–153)
BUN: 13 mg/dL (ref 7–25)
CO2: 29 mmol/L (ref 20–32)
Calcium: 9.9 mg/dL (ref 8.6–10.4)
Chloride: 100 mmol/L (ref 98–110)
Creat: 0.78 mg/dL (ref 0.50–1.03)
Globulin: 2.8 g/dL (calc) (ref 1.9–3.7)
Glucose, Bld: 85 mg/dL (ref 65–99)
Potassium: 4 mmol/L (ref 3.5–5.3)
Sodium: 141 mmol/L (ref 135–146)
Total Bilirubin: 0.4 mg/dL (ref 0.2–1.2)
Total Protein: 7.3 g/dL (ref 6.1–8.1)
eGFR: 89 mL/min/{1.73_m2} (ref >=60)

## 2022-09-30 LAB — LIPID PANEL
Cholesterol: 135 mg/dL (ref <200)
HDL: 43 mg/dL — ABNORMAL LOW (ref >=50)
LDL Cholesterol (Calc): 71 mg/dL (calc)
Non-HDL Cholesterol (Calc): 92 mg/dL (calc) (ref <130)
Total CHOL/HDL Ratio: 3.1 (calc) (ref <5.0)
Triglycerides: 126 mg/dL (ref <150)

## 2022-09-30 LAB — TSH: TSH: 5.38 mIU/L — ABNORMAL HIGH (ref 0.40–4.50)

## 2022-09-30 LAB — HEMOGLOBIN A1C
Hgb A1c MFr Bld: 5.3 % of total Hgb (ref <5.7)
Mean Plasma Glucose: 105 mg/dL
eAG (mmol/L): 5.8 mmol/L

## 2022-10-08 ENCOUNTER — Encounter: Payer: Self-pay | Admitting: Family Medicine

## 2022-10-08 ENCOUNTER — Ambulatory Visit (INDEPENDENT_AMBULATORY_CARE_PROVIDER_SITE_OTHER): Payer: PRIVATE HEALTH INSURANCE | Admitting: Family Medicine

## 2022-10-08 VITALS — BP 118/62 | HR 93 | Temp 97.9°F | Ht 63.0 in | Wt 167.8 lb

## 2022-10-08 DIAGNOSIS — E119 Type 2 diabetes mellitus without complications: Secondary | ICD-10-CM | POA: Diagnosis not present

## 2022-10-08 DIAGNOSIS — Z0000 Encounter for general adult medical examination without abnormal findings: Secondary | ICD-10-CM

## 2022-10-08 DIAGNOSIS — Z0001 Encounter for general adult medical examination with abnormal findings: Secondary | ICD-10-CM | POA: Diagnosis not present

## 2022-10-08 DIAGNOSIS — I10 Essential (primary) hypertension: Secondary | ICD-10-CM

## 2022-10-08 DIAGNOSIS — E039 Hypothyroidism, unspecified: Secondary | ICD-10-CM | POA: Diagnosis not present

## 2022-10-08 DIAGNOSIS — E7800 Pure hypercholesterolemia, unspecified: Secondary | ICD-10-CM

## 2022-10-08 DIAGNOSIS — R112 Nausea with vomiting, unspecified: Secondary | ICD-10-CM

## 2022-10-08 DIAGNOSIS — Z7985 Long-term (current) use of injectable non-insulin antidiabetic drugs: Secondary | ICD-10-CM

## 2022-10-08 MED ORDER — ONDANSETRON HCL 4 MG PO TABS
4.0000 mg | ORAL_TABLET | Freq: Three times a day (TID) | ORAL | 1 refills | Status: DC | PRN
Start: 2022-10-08 — End: 2024-02-22

## 2022-10-08 NOTE — Progress Notes (Signed)
Subjective:    Patient ID: Samantha Glover, female    DOB: 1965/08/12, 57 y.o.   MRN: [id number]  Patient is a very pleasant 57 year old Caucasian female here today for complete physical exam.  She had her Pap smear and mammogram earlier this year and her gynecologist.  Her colonoscopy has already been scheduled. Wt Readings from Last 3 Encounters:  10/08/22 167 lb 12.8 oz (76.1 kg)  04/09/22 164 lb (74.4 kg)  09/10/21 188 lb 12.8 oz (85.6 kg)   Patient's weight has stabilized.  She has lost about 20 pounds compared to last year on the [facility].  She is doing well.  She denies any nausea or vomiting or abdominal pain.  Her blood pressure is excellent.  Her A1c is at.  She is continuing to take metformin.  She denies any hypoglycemic episodes.  Her immunizations are up-to-date Lab on 09/29/2022  Component Date Value Ref Range Status   WBC 09/29/2022 7.3  3.8 - 10.8 Thousand/uL Final   RBC 09/29/2022 4.86  3.80 - 5.10 Million/uL Final   Hemoglobin 09/29/2022 14.5  11.7 - 15.5 g/dL Final   HCT 40/98/1191 43.2  35.0 - 45.0 % Final   MCV 09/29/2022 88.9  80.0 - 100.0 fL Final   MCH 09/29/2022 29.8  27.0 - 33.0 pg Final   MCHC 09/29/2022 33.6  32.0 - 36.0 g/dL Final   RDW 47/82/9562 12.0  11.0 - 15.0 % Final   Platelets 09/29/2022 255  140 - 400 Thousand/uL Final   MPV 09/29/2022 10.6  7.5 - 12.5 fL Final   Neutro Abs 09/29/2022 4,125  1,500 - 7,800 cells/uL Final   Lymphs Abs 09/29/2022 2,540  850 - 3,900 cells/uL Final   Absolute Monocytes 09/29/2022 416  200 - 950 cells/uL Final   Eosinophils Absolute 09/29/2022 168  15 - 500 cells/uL Final   Basophils Absolute 09/29/2022 51  0 - 200 cells/uL Final   Neutrophils Relative % 09/29/2022 56.5  % Final   Total Lymphocyte 09/29/2022 34.8  % Final   Monocytes Relative 09/29/2022 5.7  % Final   Eosinophils Relative 09/29/2022 2.3  % Final   Basophils Relative 09/29/2022 0.7  % Final   Glucose, Bld 09/29/2022 85  65 - 99 mg/dL Final   Comment:  .            Fasting reference interval .    BUN 09/29/2022 13  7 - 25 mg/dL Final   Creat 13/11/6576 0.78  0.50 - 1.03 mg/dL Final   eGFR 46/96/2952 89  > OR = 60 mL/min/1.48m2 Final   BUN/Creatinine Ratio 09/29/2022 SEE NOTE:  6 - 22 (calc) Final   Comment:    Not Reported: BUN and Creatinine are within    reference range. .    Sodium 09/29/2022 141  135 - 146 mmol/L Final   Potassium 09/29/2022 4.0  3.5 - 5.3 mmol/L Final   Chloride 09/29/2022 100  98 - 110 mmol/L Final   CO2 09/29/2022 29  20 - 32 mmol/L Final   Calcium 09/29/2022 9.9  8.6 - 10.4 mg/dL Final   Total Protein 84/13/2440 7.3  6.1 - 8.1 g/dL Final   Albumin 02/20/2535 4.5  3.6 - 5.1 g/dL Final   Globulin 64/40/3474 2.8  1.9 - 3.7 g/dL (calc) Final   AG Ratio 09/29/2022 1.6  1.0 - 2.5 (calc) Final   Total Bilirubin 09/29/2022 0.4  0.2 - 1.2 mg/dL Final   Alkaline phosphatase (APISO) 09/29/2022 67  37 - 153  U/L Final   AST 09/29/2022 15  10 - 35 U/L Final   ALT 09/29/2022 13  6 - 29 U/L Final   Cholesterol 09/29/2022 135  <200 mg/dL Final   HDL 16/01/9603 43 (L)  > OR = 50 mg/dL Final   Triglycerides 54/12/8117 126  <150 mg/dL Final   LDL Cholesterol (Calc) 09/29/2022 71  mg/dL (calc) Final   Comment: Reference range: <100 . Desirable range <100 mg/dL for primary prevention;   <70 mg/dL for patients with CHD or diabetic patients  with > or = 2 CHD risk factors. Marland Kitchen LDL-C is now calculated using the Martin-Hopkins  calculation, which is a validated novel method providing  better accuracy than the Friedewald equation in the  estimation of LDL-C.  Horald Pollen et al. Lenox Ahr. 1478;295(62): 2061-2068  (http://education.[facility].com/faq/FAQ164)    Total CHOL/HDL Ratio 09/29/2022 3.1  <1.3 (calc) Final   Non-HDL Cholesterol (Calc) 09/29/2022 92  <130 mg/dL (calc) Final   Comment: For patients with diabetes plus 1 major ASCVD risk  factor, treating to a non-HDL-C goal of <100 mg/dL  (LDL-C of <08 mg/dL) is  considered a therapeutic  option.    TSH 09/29/2022 5.38 (H)  0.40 - 4.50 mIU/L Final   Hgb A1c MFr Bld 09/29/2022 5.3  <5.7 % of total Hgb Final   Comment: For the purpose of screening for the presence of diabetes: . <5.7%       Consistent with the absence of diabetes 5.7-6.4%    Consistent with increased risk for diabetes             (prediabetes) > or =6.5%  Consistent with diabetes . This assay result is consistent with a decreased risk of diabetes. . Currently, no consensus exists regarding use of hemoglobin A1c for diagnosis of diabetes in children. . According to American Diabetes Association (ADA) guidelines, hemoglobin A1c <7.0% represents optimal control in non-pregnant diabetic patients. Different metrics may apply to specific patient populations.  Standards of Medical Care in Diabetes(ADA). .    Mean Plasma Glucose 09/29/2022 105  mg/dL Final   eAG (mmol/L) 65/78/4696 5.8  mmol/L Final   Comment: . This test was performed on the Roche cobas c503 platform. Effective 02/01/22, a change in test platforms from the Abbott Architect to the Roche cobas c503 may have shifted HbA1c results compared to historical results. Based on laboratory validation testing conducted at [facility], the Roche platform relative to the Abbott platform had an average increase in HbA1c value of < or = 0.3%. This difference is within accepted  variability established by the [facility]. Note that not all individuals will have had a shift in their results and direct comparisons between historical and current results for testing conducted on different platforms is not recommended.     Past Medical History:  Diagnosis Date   ADD (attention deficit disorder)    Diabetes mellitus without complication (HCC)    Hypercholesterolemia    Hypertension    Thyroid disorder    hypothyroidism   Past Surgical History:  Procedure Laterality Date   BREAST SURGERY      CERVIX SURGERY     EYE SURGERY     Current Outpatient Medications on File Prior to Visit  Medication Sig Dispense Refill   atorvastatin (LIPITOR) 20 MG tablet TAKE 1 TABLET BY MOUTH ONCE  DAILY 90 tablet 2   hydrochlorothiazide (HYDRODIURIL) 25 MG tablet TAKE 1 TABLET (25 MG TOTAL) BY MOUTH DAILY. 90 tablet 1   levothyroxine (  SYNTHROID) 88 MCG tablet Take 1 tablet (88 mcg total) by mouth daily. 90 tablet 3   losartan (COZAAR) 50 MG tablet TAKE 1 TABLET BY MOUTH  DAILY 90 tablet 3   metFORMIN (GLUCOPHAGE) 1000 MG tablet TAKE 1 TABLET BY MOUTH TWICE  DAILY WITH MEALS 180 tablet 3   tirzepatide (MOUNJARO) 10 MG/0.5ML Pen Inject 1 pen (10 mg) into the skin once a week. 6 mL 1   No current facility-administered medications on file prior to visit.   No Known Allergies Social History   Socioeconomic History   Marital status: Married    Spouse name: Not on file   Number of children: Not on file   Years of education: Not on file   Highest education level: Not on file  Occupational History   Not on file  Tobacco Use   Smoking status: Former   Smokeless tobacco: Never  Substance and Sexual Activity   Alcohol use: No   Drug use: No   Sexual activity: Not on file    Comment: married.  2 sons.  Currently, stay at home mom.  Other Topics Concern   Not on file  Social History Narrative   Not on file   Social Determinants of Health   Financial Resource Strain: Not on file  Food Insecurity: Not on file  Transportation Needs: Not on file  Physical Activity: Not on file  Stress: Not on file  Social Connections: Not on file  Intimate Partner Violence: Not on file      Review of Systems  All other systems reviewed and are negative.      Objective:   Physical Exam Vitals reviewed.  Constitutional:      General: She is not in acute distress.    Appearance: Normal appearance. She is obese. She is not ill-appearing or toxic-appearing.  HENT:     Head: Normocephalic and  atraumatic.     Right Ear: Tympanic membrane and ear canal normal.     Left Ear: Tympanic membrane and ear canal normal.     Nose: Nose normal. No congestion or rhinorrhea.     Mouth/Throat:     Mouth: Mucous membranes are moist.     Pharynx: No oropharyngeal exudate or posterior oropharyngeal erythema.  Eyes:     General: No scleral icterus.       Right eye: No discharge.        Left eye: No discharge.     Extraocular Movements: Extraocular movements intact.     Pupils: Pupils are equal, round, and reactive to light.  Neck:     Vascular: No carotid bruit.  Cardiovascular:     Rate and Rhythm: Normal rate and regular rhythm.     Pulses: Normal pulses.     Heart sounds: Normal heart sounds. No murmur heard.    No friction rub. No gallop.  Pulmonary:     Effort: Pulmonary effort is normal. No respiratory distress.     Breath sounds: Normal breath sounds. No stridor. No wheezing, rhonchi or rales.  Chest:     Chest wall: No tenderness.  Abdominal:     General: Abdomen is flat. There is no distension.     Palpations: Abdomen is soft. There is no mass.     Tenderness: There is no abdominal tenderness. There is no guarding or rebound.     Hernia: No hernia is present.  Musculoskeletal:     Cervical back: Normal range of motion and neck supple. No rigidity.  Right lower leg: No edema.     Left lower leg: No edema.  Lymphadenopathy:     Cervical: No cervical adenopathy.  Skin:    Coloration: Skin is not jaundiced or pale.     Findings: No bruising, erythema, lesion or rash.  Neurological:     General: No focal deficit present.     Mental Status: She is alert and oriented to person, place, and time. Mental status is at baseline.     Cranial Nerves: No cranial nerve deficit.     Motor: No weakness.     Gait: Gait normal.  Psychiatric:        Mood and Affect: Mood normal.        Behavior: Behavior normal.        Thought Content: Thought content normal.        Judgment:  Judgment normal.            Assessment & Plan:  Diabetes mellitus without complication (HCC) - Plan: ondansetron (ZOFRAN) 4 MG tablet  Nausea and vomiting, unspecified vomiting type - Plan: ondansetron (ZOFRAN) 4 MG tablet  Hypothyroidism, unspecified type  General medical exam  Essential hypertension  Pure hypercholesterolemia I continue to congratulate the patient for weight loss.  A1c is outstanding.  Recommended the patient discontinue metformin.  We discussed increasing Mounjaro to 12.5 mg daily to compensate and assist in weight loss but at the present time the patient prefers to stay on current dose.  Blood pressure is excellent.  Cholesterol is outstanding.  Cancer screening is up-to-date except for colonoscopy which is pending.  TSH is slightly elevated however the patient missed a few doses of her levothyroxine prior to the lab draw.  We decided to recheck her TSH in 6 months

## 2022-10-20 ENCOUNTER — Ambulatory Visit (AMBULATORY_SURGERY_CENTER): Payer: PRIVATE HEALTH INSURANCE | Admitting: *Deleted

## 2022-10-20 VITALS — Ht 63.0 in | Wt 167.0 lb

## 2022-10-20 DIAGNOSIS — Z83719 Family history of colon polyps, unspecified: Secondary | ICD-10-CM

## 2022-10-20 MED ORDER — NA SULFATE-K SULFATE-MG SULF 17.5-3.13-1.6 GM/177ML PO SOLN
1.0000 | Freq: Once | ORAL | 0 refills | Status: AC
Start: 2022-10-20 — End: 2022-10-20

## 2022-10-20 NOTE — Progress Notes (Signed)
Pt's previsit is done over the phone and all paperwork (prep instructions) sent to patient.  Pt's name and DOB verified at the beginning of the previsit.  Pt denies any difficulty with ambulating.    No egg or soy allergy known to patient  No issues known to pt with past sedation with any surgeries or procedures Patient denies trouble moving neck  No FH of Malignant Hyperthermia Pt is not on diet pills Pt is not on  home 02  Pt is not on blood thinners  Pt denies issues with constipation  Pt is not on dialysis Pt denies any upcoming cardiac testing Pt encouraged to use to use Singlecare or Goodrx to reduce cost  Patient's chart reviewed by John Nulty CNRA prior to previsit and patient appropriate for the [facility].  Previsit completed and red dot placed by patient's name on their procedure day (on provider's schedule).    

## 2022-10-29 ENCOUNTER — Other Ambulatory Visit: Payer: Self-pay | Admitting: Family Medicine

## 2022-10-31 ENCOUNTER — Other Ambulatory Visit: Payer: Self-pay | Admitting: Family Medicine

## 2022-11-01 NOTE — Telephone Encounter (Signed)
Requested Prescriptions  Pending Prescriptions Disp Refills   losartan (COZAAR) 50 MG tablet [Pharmacy Med Name: Losartan Potassium 50 MG Oral Tablet] 90 tablet 1    Sig: TAKE 1 TABLET BY MOUTH DAILY     Cardiovascular:  Angiotensin Receptor Blockers Failed - 10/29/2022 10:02 PM      Failed - Valid encounter within last 6 months    Recent Outpatient Visits           1 year ago General medical exam   [facility] Donita Brooks, MD   1 year ago Need for immunization against influenza   [facility] Donita Brooks, MD   1 year ago Subcutaneous mass of right forearm   [facility] Donita Brooks, MD   2 years ago General medical exam   [facility] Donita Brooks, MD   2 years ago Need for immunization against influenza   [facility] Donita Brooks, MD       Future Appointments             In 5 months Pickard, Priscille Heidelberg, MD [facility]            Passed - Cr in normal range and within 180 days    Creat  Date Value Ref Range Status  09/29/2022 0.78 0.50 - 1.03 mg/dL Final   Creatinine, Urine  Date Value Ref Range Status  04/09/2022 152 20 - 275 mg/dL Final         Passed - K in normal range and within 180 days    Potassium  Date Value Ref Range Status  09/29/2022 4.0 3.5 - 5.3 mmol/L Final         Passed - Patient is not pregnant      Passed - Last BP in normal range    BP Readings from Last 1 Encounters:  10/08/22 118/62

## 2022-11-01 NOTE — Telephone Encounter (Signed)
Requested medication (s) are due for refill today: yes  Requested medication (s) are on the active medication list: yes  Last refill:  05/06/22  Future visit scheduled: yes  Notes to clinic:  Medication not assigned to a protocol, review manually.      Requested Prescriptions  Pending Prescriptions Disp Refills   MOUNJARO 10 MG/0.5ML Pen [Pharmacy Med Name: MOUNJARO 10 MG/0.5 ML PEN]  1    Sig: Inject 1 pen (10 mg) into the skin once a week.     Off-Protocol Failed - 10/31/2022  1:13 AM      Failed - Medication not assigned to a protocol, review manually.      Failed - Valid encounter within last 12 months    Recent Outpatient Visits           1 year ago General medical exam   [facility] Donita Brooks, MD   1 year ago Need for immunization against influenza   [facility] Tanya Nones Priscille Heidelberg, MD   1 year ago Subcutaneous mass of right forearm   [facility] Tanya Nones, Priscille Heidelberg, MD   2 years ago General medical exam   [facility] Donita Brooks, MD   2 years ago Need for immunization against influenza   [facility] Pickard, Priscille Heidelberg, MD       Future Appointments             In 5 months Pickard, Priscille Heidelberg, MD [facility]

## 2022-11-16 ENCOUNTER — Encounter: Payer: Self-pay | Admitting: Internal Medicine

## 2022-11-19 ENCOUNTER — Ambulatory Visit (AMBULATORY_SURGERY_CENTER): Payer: PRIVATE HEALTH INSURANCE | Admitting: Internal Medicine

## 2022-11-19 ENCOUNTER — Encounter: Payer: Self-pay | Admitting: Internal Medicine

## 2022-11-19 VITALS — BP 88/54 | HR 88 | Temp 97.7°F | Resp 18 | Ht 63.0 in | Wt 167.0 lb

## 2022-11-19 DIAGNOSIS — D123 Benign neoplasm of transverse colon: Secondary | ICD-10-CM | POA: Diagnosis not present

## 2022-11-19 DIAGNOSIS — Z1211 Encounter for screening for malignant neoplasm of colon: Secondary | ICD-10-CM | POA: Diagnosis present

## 2022-11-19 DIAGNOSIS — Z83719 Family history of colon polyps, unspecified: Secondary | ICD-10-CM

## 2022-11-19 DIAGNOSIS — K635 Polyp of colon: Secondary | ICD-10-CM | POA: Diagnosis not present

## 2022-11-19 MED ORDER — SODIUM CHLORIDE 0.9 % IV SOLN: 500.0000 mL | Freq: Once | INTRAVENOUS | Status: DC

## 2022-11-19 NOTE — Patient Instructions (Signed)

## 2022-11-19 NOTE — Progress Notes (Signed)
GASTROENTEROLOGY PROCEDURE H&P NOTE   Primary Care Physician: Donita Brooks, MD    Reason for Procedure:   Fam hx of colon cancer  Plan:    colonoscopy  Patient is appropriate for endoscopic procedure(s) in the ambulatory ([facility]) setting.  The nature of the procedure, as well as the risks, benefits, and alternatives were carefully and thoroughly reviewed with the patient. Ample time for discussion and questions allowed. The patient understood, was satisfied, and agreed to proceed.     HPI: Samantha Glover is a 57 y.o. female who presents for screening colonoscopy.  Medical history as below.  Tolerated the prep.  No recent chest pain or shortness of breath.  No abdominal pain today.  Past Medical History:  Diagnosis Date   ADD (attention deficit disorder)    Diabetes mellitus without complication (HCC)    Hypercholesterolemia    Hypertension    Thyroid disorder    hypothyroidism    Past Surgical History:  Procedure Laterality Date   BREAST SURGERY Left    lipoma removed   CERVIX SURGERY     ablation   COLONOSCOPY     EYE SURGERY     Lasik    Prior to Admission medications   Medication Sig Start Date End Date Taking? Authorizing Provider  atorvastatin (LIPITOR) 20 MG tablet TAKE 1 TABLET BY MOUTH ONCE  DAILY 07/26/22   Donita Brooks, MD  hydrochlorothiazide (HYDRODIURIL) 25 MG tablet TAKE 1 TABLET (25 MG TOTAL) BY MOUTH DAILY. 06/28/22   Donita Brooks, MD  levothyroxine (SYNTHROID) 88 MCG tablet Take 1 tablet (88 mcg total) by mouth daily. 04/12/22   Donita Brooks, MD  losartan (COZAAR) 50 MG tablet TAKE 1 TABLET BY MOUTH DAILY 11/01/22   Donita Brooks, MD  ondansetron (ZOFRAN) 4 MG tablet Take 1 tablet (4 mg total) by mouth every 8 (eight) hours as needed for nausea or vomiting. 10/08/22   Donita Brooks, MD  tirzepatide [facility]) 10 MG/0.5ML Pen INJECT 1 PEN (10 MG) INTO THE SKIN ONCE A WEEK. 11/04/22   Donita Brooks, MD    Current Outpatient  Medications  Medication Sig Dispense Refill   atorvastatin (LIPITOR) 20 MG tablet TAKE 1 TABLET BY MOUTH ONCE  DAILY 90 tablet 2   hydrochlorothiazide (HYDRODIURIL) 25 MG tablet TAKE 1 TABLET (25 MG TOTAL) BY MOUTH DAILY. 90 tablet 1   levothyroxine (SYNTHROID) 88 MCG tablet Take 1 tablet (88 mcg total) by mouth daily. 90 tablet 3   losartan (COZAAR) 50 MG tablet TAKE 1 TABLET BY MOUTH DAILY 90 tablet 1   ondansetron (ZOFRAN) 4 MG tablet Take 1 tablet (4 mg total) by mouth every 8 (eight) hours as needed for nausea or vomiting. 20 tablet 1   tirzepatide (MOUNJARO) 10 MG/0.5ML Pen INJECT 1 PEN (10 MG) INTO THE SKIN ONCE A WEEK. 6 mL 1   Current Facility-Administered Medications  Medication Dose Route Frequency Provider Last Rate Last Admin   0.9 %  sodium chloride infusion  500 mL Intravenous Once Elianne Gubser, Carie Caddy, MD        Allergies as of 11/19/2022   (No Known Allergies)    Family History  Problem Relation Age of Onset   Colon polyps Mother    Hyperlipidemia Mother    Hypertension Mother    Non-Hodgkin's lymphoma Mother    CVA Mother    Hyperlipidemia Sister    Hypertension Sister    Hyperlipidemia Sister    Hypertension Sister  Colon cancer Maternal Grandmother    Esophageal cancer Neg Hx    Stomach cancer Neg Hx    Rectal cancer Neg Hx     Social History   Socioeconomic History   Marital status: Married    Spouse name: Not on file   Number of children: Not on file   Years of education: Not on file   Highest education level: Not on file  Occupational History   Not on file  Tobacco Use   Smoking status: Never   Smokeless tobacco: Never  Vaping Use   Vaping status: Never Used  Substance and Sexual Activity   Alcohol use: No   Drug use: No   Sexual activity: Not on file    Comment: married.  2 sons.  Currently, stay at home mom.  Other Topics Concern   Not on file  Social History Narrative   Not on file   Social Determinants of Health   Financial Resource  Strain: Not on file  Food Insecurity: Not on file  Transportation Needs: Not on file  Physical Activity: Not on file  Stress: Not on file  Social Connections: Not on file  Intimate Partner Violence: Not on file    Physical Exam: Vital signs in last 24 hours: @BP  104/62   Pulse 88   Temp 97.7 F (36.5 C) (Temporal)   Ht 5\' 3"  (1.6 m)   Wt 167 lb (75.8 kg)   SpO2 98%   BMI 29.58 kg/m  GEN: NAD EYE: Sclerae anicteric ENT: MMM CV: Non-tachycardic Pulm: CTA b/l GI: Soft, NT/ND NEURO:  Alert & Oriented x 3   Erick Blinks, MD [facility]  11/19/2022 8:56 AM

## 2022-11-19 NOTE — Progress Notes (Signed)
Report to PACU, RN, vss, BBS= Clear.  

## 2022-11-19 NOTE — Op Note (Signed)
[facility] Patient Name: Samantha Glover Procedure Date: 11/19/2022 8:53 AM MRN: [id number] Endoscopist: Beverley Fiedler , MD, [id number] Age: 57 Referring MD:  Date of Birth: Apr 12, 1966 Gender: Female Account #: [id number] Procedure:                Colonoscopy Indications:              Screening in patient at increased risk: Family                            history of colorectal cancer, Last colonoscopy:                            2018 (Dr. Bosie Clos) Medicines:                Monitored Anesthesia Care Procedure:                Pre-Anesthesia Assessment:                           - Prior to the procedure, a History and Physical                            was performed, and patient medications and                            allergies were reviewed. The patient's tolerance of                            previous anesthesia was also reviewed. The risks                            and benefits of the procedure and the sedation                            options and risks were discussed with the patient.                            All questions were answered, and informed consent                            was obtained. Prior Anticoagulants: The patient has                            taken no anticoagulant or antiplatelet agents. ASA                            Grade Assessment: II - A patient with mild systemic                            disease. After reviewing the risks and benefits,                            the patient was deemed in satisfactory condition to  undergo the procedure.                           After obtaining informed consent, the colonoscope                            was passed under direct vision. Throughout the                            procedure, the patient's blood pressure, pulse, and                            oxygen saturations were monitored continuously. The                            Olympus CF-HQ190L ([id number]) Colonoscope was                             introduced through the anus and advanced to the                            cecum, identified by appendiceal orifice and                            ileocecal valve. The colonoscopy was performed                            without difficulty. The patient tolerated the                            procedure well. The quality of the bowel                            preparation was good. The ileocecal valve,                            appendiceal orifice, and rectum were photographed. Scope In: 9:07:24 AM Scope Out: 9:20:56 AM Scope Withdrawal Time: 0 hours 9 minutes 15 seconds  Total Procedure Duration: 0 hours 13 minutes 32 seconds  Findings:                 The digital rectal exam was normal.                           A 5 mm polyp was found in the transverse colon. The                            polyp was sessile. The polyp was removed with a                            cold snare. Resection and retrieval were complete.                           Multiple medium-mouthed and small-mouthed  diverticula were found in the sigmoid colon,                            descending colon and ascending colon.                           The retroflexed view of the distal rectum and anal                            verge was normal and showed no anal or rectal                            abnormalities. Complications:            No immediate complications. Estimated Blood Loss:     Estimated blood loss: none. Impression:               - One 5 mm polyp in the transverse colon, removed                            with a cold snare. Resected and retrieved.                           - Moderate diverticulosis in the sigmoid colon, in                            the descending colon and in the ascending colon.                           - The distal rectum and anal verge are normal on                            retroflexion view. Recommendation:           - Patient  has a contact number available for                            emergencies. The signs and symptoms of potential                            delayed complications were discussed with the                            patient. Return to normal activities tomorrow.                            Written discharge instructions were provided to the                            patient.                           - Resume previous diet.                           - Continue present medications.                           -  Await pathology results.                           - Repeat colonoscopy is recommended. The                            colonoscopy date will be determined after pathology                            results from today's exam become available for                            review. Beverley Fiedler, MD 11/19/2022 9:23:49 AM This report has been signed electronically.

## 2022-11-19 NOTE — Progress Notes (Signed)
Called to room to assist during endoscopic procedure.  Patient ID and intended procedure confirmed with present staff. Received instructions for my participation in the procedure from the performing physician.  

## 2022-11-19 NOTE — Progress Notes (Signed)
Pt's states no medical or surgical changes since previsit or office visit. 

## 2022-11-22 ENCOUNTER — Telehealth: Payer: Self-pay

## 2022-11-22 NOTE — Telephone Encounter (Signed)
Left message on follow up call. 

## 2022-11-23 ENCOUNTER — Encounter: Payer: Self-pay | Admitting: Internal Medicine

## 2022-11-28 ENCOUNTER — Other Ambulatory Visit: Payer: Self-pay | Admitting: Family Medicine

## 2022-11-29 NOTE — Telephone Encounter (Signed)
Requested Prescriptions  Refused Prescriptions Disp Refills   levothyroxine (SYNTHROID) 75 MCG tablet [Pharmacy Med Name: LEVOTHYROXINE 75 MCG TABLET] 90 tablet 3    Sig: TAKE 1 TABLET BY MOUTH EVERY DAY     Endocrinology:  Hypothyroid Agents Failed - 11/28/2022  1:19 AM      Failed - TSH in normal range and within 360 days    TSH  Date Value Ref Range Status  09/29/2022 5.38 (H) 0.40 - 4.50 mIU/L Final         Failed - Valid encounter within last 12 months    Recent Outpatient Visits           1 year ago General medical exam   [facility] Donita Brooks, MD   1 year ago Need for immunization against influenza   [facility] Donita Brooks, MD   2 years ago Subcutaneous mass of right forearm   [facility] Pickard, Priscille Heidelberg, MD   2 years ago General medical exam   [facility] Donita Brooks, MD   2 years ago Need for immunization against influenza   [facility] Pickard, Priscille Heidelberg, MD       Future Appointments             In 4 months Pickard, Priscille Heidelberg, MD [facility], [facility]

## 2022-12-08 ENCOUNTER — Encounter: Payer: Self-pay | Admitting: Family Medicine

## 2022-12-08 ENCOUNTER — Other Ambulatory Visit: Payer: Self-pay

## 2022-12-08 DIAGNOSIS — E119 Type 2 diabetes mellitus without complications: Secondary | ICD-10-CM

## 2022-12-08 MED ORDER — TIRZEPATIDE 12.5 MG/0.5ML ~~LOC~~ SOAJ
12.5000 mg | SUBCUTANEOUS | 0 refills | Status: DC
Start: 2022-12-08 — End: 2023-03-29

## 2022-12-31 ENCOUNTER — Other Ambulatory Visit: Payer: Self-pay | Admitting: Family Medicine

## 2022-12-31 NOTE — Telephone Encounter (Signed)
Requested Prescriptions  Pending Prescriptions Disp Refills   hydrochlorothiazide (HYDRODIURIL) 25 MG tablet [Pharmacy Med Name: HYDROCHLOROTHIAZIDE 25 MG TAB] 90 tablet 1    Sig: TAKE 1 TABLET (25 MG TOTAL) BY MOUTH DAILY.     Cardiovascular: Diuretics - Thiazide Failed - 12/31/2022  1:33 AM      Failed - Last BP in normal range    BP Readings from Last 1 Encounters:  11/19/22 (!) 88/54         Failed - Valid encounter within last 6 months    Recent Outpatient Visits           1 year ago General medical exam   [facility] Donita Brooks, MD   1 year ago Need for immunization against influenza   [facility] Donita Brooks, MD   2 years ago Subcutaneous mass of right forearm   [facility] Donita Brooks, MD   2 years ago General medical exam   [facility] Donita Brooks, MD   2 years ago Need for immunization against influenza   [facility] Donita Brooks, MD       Future Appointments             In 3 months Pickard, Priscille Heidelberg, MD [place]            Passed - Cr in normal range and within 180 days    Creat  Date Value Ref Range Status  09/29/2022 0.78 0.50 - 1.03 mg/dL Final   Creatinine, Urine  Date Value Ref Range Status  04/09/2022 152 20 - 275 mg/dL Final         Passed - K in normal range and within 180 days    Potassium  Date Value Ref Range Status  09/29/2022 4.0 3.5 - 5.3 mmol/L Final         Passed - Na in normal range and within 180 days    Sodium  Date Value Ref Range Status  09/29/2022 141 135 - 146 mmol/L Final

## 2023-02-04 ENCOUNTER — Ambulatory Visit (INDEPENDENT_AMBULATORY_CARE_PROVIDER_SITE_OTHER): Payer: PRIVATE HEALTH INSURANCE

## 2023-02-04 DIAGNOSIS — Z23 Encounter for immunization: Secondary | ICD-10-CM

## 2023-02-04 DIAGNOSIS — E119 Type 2 diabetes mellitus without complications: Secondary | ICD-10-CM

## 2023-02-04 NOTE — Progress Notes (Signed)
Fluaval flu vaccine given in L deltoid as ordered. Pt tolerated well. Mjp,lpn

## 2023-03-16 ENCOUNTER — Other Ambulatory Visit: Payer: Self-pay | Admitting: Family Medicine

## 2023-03-17 NOTE — Telephone Encounter (Signed)
Requested Prescriptions  Pending Prescriptions Disp Refills   atorvastatin (LIPITOR) 20 MG tablet [Pharmacy Med Name: Atorvastatin Calcium 20 MG Oral Tablet] 90 tablet 1    Sig: TAKE 1 TABLET BY MOUTH ONCE  DAILY     Cardiovascular:  Antilipid - Statins Failed - 03/16/2023 11:08 PM      Failed - Valid encounter within last 12 months    Recent Outpatient Visits           1 year ago General medical exam   [facility] Donita Brooks, MD   2 years ago Need for immunization against influenza   [facility] Donita Brooks, MD   2 years ago Subcutaneous mass of right forearm   [facility] Pickard, Priscille Heidelberg, MD   2 years ago General medical exam   [facility] Donita Brooks, MD   2 years ago Need for immunization against influenza   [facility] Pickard, Priscille Heidelberg, MD       Future Appointments             In 4 weeks Tanya Nones, Priscille Heidelberg, MD [facility], [facility]            Failed - Lipid Panel in normal range within the last 12 months    Cholesterol  Date Value Ref Range Status  09/29/2022 135 <200 mg/dL Final   LDL Cholesterol (Calc)  Date Value Ref Range Status  09/29/2022 71 mg/dL (calc) Final    Comment:    Reference range: <100 . Desirable range <100 mg/dL for primary prevention;   <70 mg/dL for patients with CHD or diabetic patients  with > or = 2 CHD risk factors. Marland Kitchen LDL-C is now calculated using the Martin-Hopkins  calculation, which is a validated novel method providing  better accuracy than the Friedewald equation in the  estimation of LDL-C.  Horald Pollen et al. Lenox Ahr. 2952;841(32): 2061-2068  (http://education.[facility].com/faq/FAQ164)    HDL  Date Value Ref Range Status  09/29/2022 43 (L) > OR = 50 mg/dL Final   Triglycerides  Date Value Ref Range Status  09/29/2022 126 <150 mg/dL Final         Passed - Patient is not pregnant

## 2023-03-28 ENCOUNTER — Other Ambulatory Visit: Payer: Self-pay | Admitting: Family Medicine

## 2023-03-29 ENCOUNTER — Other Ambulatory Visit: Payer: Self-pay

## 2023-03-29 ENCOUNTER — Encounter: Payer: Self-pay | Admitting: Family Medicine

## 2023-03-29 DIAGNOSIS — E119 Type 2 diabetes mellitus without complications: Secondary | ICD-10-CM

## 2023-03-29 MED ORDER — TIRZEPATIDE 12.5 MG/0.5ML ~~LOC~~ SOAJ: 12.5000 mg | SUBCUTANEOUS | 0 refills | Status: DC

## 2023-03-30 ENCOUNTER — Other Ambulatory Visit: Payer: Self-pay

## 2023-03-30 DIAGNOSIS — E119 Type 2 diabetes mellitus without complications: Secondary | ICD-10-CM

## 2023-03-30 MED ORDER — TIRZEPATIDE 15 MG/0.5ML ~~LOC~~ SOAJ
15.0000 mg | SUBCUTANEOUS | 1 refills | Status: AC
Start: 2023-03-30 — End: ?

## 2023-03-31 ENCOUNTER — Other Ambulatory Visit: Payer: Self-pay | Admitting: Family Medicine

## 2023-03-31 MED ORDER — LOSARTAN POTASSIUM 50 MG PO TABS: 50.0000 mg | ORAL_TABLET | Freq: Every day | ORAL | 1 refills | Status: DC

## 2023-03-31 NOTE — Telephone Encounter (Signed)
Prescription Request  03/31/2023  LOV: 10/08/2022  What is the name of the medication or equipment? losartan (COZAAR) 50 MG tablet   Have you contacted your pharmacy to request a refill? Yes   Which pharmacy would you like this sent to?  [facility] [place], [place] - [address] [place] [address] [place] [address] Phone: [phone] Fax: [phone]    Patient notified that their request is being sent to the clinical staff for review and that they should receive a response within 2 business days.   Please advise at [facility] [phone]

## 2023-03-31 NOTE — Telephone Encounter (Signed)
Requested Prescriptions  Pending Prescriptions Disp Refills   losartan (COZAAR) 50 MG tablet 90 tablet 1    Sig: Take 1 tablet (50 mg total) by mouth daily.     Cardiovascular:  Angiotensin Receptor Blockers Failed - 03/31/2023 11:40 AM      Failed - Cr in normal range and within 180 days    Creat  Date Value Ref Range Status  09/29/2022 0.78 0.50 - 1.03 mg/dL Final   Creatinine, Urine  Date Value Ref Range Status  04/09/2022 152 20 - 275 mg/dL Final         Failed - K in normal range and within 180 days    Potassium  Date Value Ref Range Status  09/29/2022 4.0 3.5 - 5.3 mmol/L Final         Failed - Last BP in normal range    BP Readings from Last 1 Encounters:  11/19/22 (!) 88/54         Failed - Valid encounter within last 6 months    Recent Outpatient Visits           1 year ago General medical exam   [facility] Donita Brooks, MD   2 years ago Need for immunization against influenza   [facility] Donita Brooks, MD   2 years ago Subcutaneous mass of right forearm   [facility] Tanya Nones, Priscille Heidelberg, MD   2 years ago General medical exam   [facility] Donita Brooks, MD   3 years ago Need for immunization against influenza   [facility] Pickard, Priscille Heidelberg, MD       Future Appointments             In 2 weeks Pickard, Priscille Heidelberg, MD [place] [facility], [facility]            Passed - Patient is not pregnant

## 2023-03-31 NOTE — Telephone Encounter (Signed)
Already ordered on 03/31/23 in a separate refill encounter. Will refuse due to this.   Requested Prescriptions  Pending Prescriptions Disp Refills   losartan (COZAAR) 50 MG tablet [Pharmacy Med Name: Losartan Potassium 50 MG Oral Tablet] 90 tablet 3    Sig: TAKE 1 TABLET BY MOUTH DAILY     Cardiovascular:  Angiotensin Receptor Blockers Failed - 03/28/2023 10:57 PM      Failed - Last BP in normal range    BP Readings from Last 1 Encounters:  11/19/22 (!) 88/54         Failed - Valid encounter within last 6 months    Recent Outpatient Visits           1 year ago General medical exam   [facility] Donita Brooks, MD   2 years ago Need for immunization against influenza   [facility] Donita Brooks, MD   2 years ago Subcutaneous mass of right forearm   [facility] Pickard, Priscille Heidelberg, MD   2 years ago General medical exam   [facility] Donita Brooks, MD   3 years ago Need for immunization against influenza   [facility] Donita Brooks, MD       Future Appointments             In 2 weeks Tanya Nones, Priscille Heidelberg, MD [place] [facility], [facility]            Passed - Cr in normal range and within 180 days    Creat  Date Value Ref Range Status  09/29/2022 0.78 0.50 - 1.03 mg/dL Final   Creatinine, Urine  Date Value Ref Range Status  04/09/2022 152 20 - 275 mg/dL Final         Passed - K in normal range and within 180 days    Potassium  Date Value Ref Range Status  09/29/2022 4.0 3.5 - 5.3 mmol/L Final         Passed - Patient is not pregnant

## 2023-04-15 ENCOUNTER — Encounter: Payer: Self-pay | Admitting: Family Medicine

## 2023-04-15 ENCOUNTER — Ambulatory Visit (INDEPENDENT_AMBULATORY_CARE_PROVIDER_SITE_OTHER): Payer: PRIVATE HEALTH INSURANCE | Admitting: Family Medicine

## 2023-04-15 VITALS — BP 124/72 | HR 62 | Temp 98.5°F

## 2023-04-15 DIAGNOSIS — I10 Essential (primary) hypertension: Secondary | ICD-10-CM | POA: Diagnosis not present

## 2023-04-15 DIAGNOSIS — E1169 Type 2 diabetes mellitus with other specified complication: Secondary | ICD-10-CM

## 2023-04-15 DIAGNOSIS — E7800 Pure hypercholesterolemia, unspecified: Secondary | ICD-10-CM | POA: Diagnosis not present

## 2023-04-15 DIAGNOSIS — E039 Hypothyroidism, unspecified: Secondary | ICD-10-CM

## 2023-04-15 DIAGNOSIS — Z7985 Long-term (current) use of injectable non-insulin antidiabetic drugs: Secondary | ICD-10-CM

## 2023-04-15 DIAGNOSIS — E119 Type 2 diabetes mellitus without complications: Secondary | ICD-10-CM

## 2023-04-15 NOTE — Progress Notes (Signed)
Subjective:    Patient ID: Samantha Glover, female    DOB: March 24, 1966, 57 y.o.   MRN: [id number]  Patient is a very pleasant 57 year old Caucasian female here today for complete physical exam.   Wt Readings from Last 3 Encounters:  11/19/22 167 lb (75.8 kg)  10/20/22 167 lb (75.8 kg)  10/08/22 167 lb 12.8 oz (76.1 kg)   Patient is here today for fasting lab work.  At her last visit we increased levothyroxine due to an elevated TSH.  She is tolerating the medication without side effects.  Since I last saw the patient her mother passed away from a massive stroke.  She is dealing with grief.  She is currently on 15 mg of Mounjaro.  She just started the higher dose.  She denies any nausea or vomiting or diarrhea however she has not seen any significant weight loss.  Her blood pressure today is excellent at 124/72.  Her flu shot is up-to-date.   Past Medical History:  Diagnosis Date   ADD (attention deficit disorder)    Diabetes mellitus without complication (HCC)    Hypercholesterolemia    Hypertension    Thyroid disorder    hypothyroidism   Past Surgical History:  Procedure Laterality Date   BREAST SURGERY Left    lipoma removed   CERVIX SURGERY     ablation   COLONOSCOPY     EYE SURGERY     Lasik   Current Outpatient Medications on File Prior to Visit  Medication Sig Dispense Refill   atorvastatin (LIPITOR) 20 MG tablet TAKE 1 TABLET BY MOUTH ONCE  DAILY 90 tablet 1   hydrochlorothiazide (HYDRODIURIL) 25 MG tablet TAKE 1 TABLET (25 MG TOTAL) BY MOUTH DAILY. 90 tablet 1   levothyroxine (SYNTHROID) 88 MCG tablet Take 1 tablet (88 mcg total) by mouth daily. 90 tablet 3   losartan (COZAAR) 50 MG tablet Take 1 tablet (50 mg total) by mouth daily. 90 tablet 1   ondansetron (ZOFRAN) 4 MG tablet Take 1 tablet (4 mg total) by mouth every 8 (eight) hours as needed for nausea or vomiting. 20 tablet 1   tirzepatide (MOUNJARO) 12.5 MG/0.5ML Pen Inject 12.5 mg into the skin once a week. 6 mL 0    tirzepatide (MOUNJARO) 15 MG/0.5ML Pen Inject 15 mg into the skin once a week. 6 mL 1   No current facility-administered medications on file prior to visit.   No Known Allergies Social History   Socioeconomic History   Marital status: Married    Spouse name: Not on file   Number of children: Not on file   Years of education: Not on file   Highest education level: Not on file  Occupational History   Not on file  Tobacco Use   Smoking status: Never   Smokeless tobacco: Never  Vaping Use   Vaping status: Never Used  Substance and Sexual Activity   Alcohol use: No   Drug use: No   Sexual activity: Yes    Birth control/protection: Surgical    Comment: married.  2 sons.  Currently, stay at home mom.  Other Topics Concern   Not on file  Social History Narrative   Not on file   Social Drivers of Health   Financial Resource Strain: Not on file  Food Insecurity: Not on file  Transportation Needs: Not on file  Physical Activity: Not on file  Stress: Not on file  Social Connections: Not on file  Intimate Partner Violence: Not on  file      Review of Systems  All other systems reviewed and are negative.      Objective:   Physical Exam Vitals reviewed.  Constitutional:      General: She is not in acute distress.    Appearance: Normal appearance. She is obese. She is not ill-appearing or toxic-appearing.  HENT:     Head: Normocephalic and atraumatic.     Right Ear: Tympanic membrane and ear canal normal.     Left Ear: Tympanic membrane and ear canal normal.     Nose: Nose normal. No congestion or rhinorrhea.     Mouth/Throat:     Mouth: Mucous membranes are moist.     Pharynx: No oropharyngeal exudate or posterior oropharyngeal erythema.  Eyes:     General: No scleral icterus.       Right eye: No discharge.        Left eye: No discharge.     Extraocular Movements: Extraocular movements intact.     Pupils: Pupils are equal, round, and reactive to light.  Neck:      Vascular: No carotid bruit.  Cardiovascular:     Rate and Rhythm: Normal rate and regular rhythm.     Pulses: Normal pulses.     Heart sounds: Normal heart sounds. No murmur heard.    No friction rub. No gallop.  Pulmonary:     Effort: Pulmonary effort is normal. No respiratory distress.     Breath sounds: Normal breath sounds. No stridor. No wheezing, rhonchi or rales.  Chest:     Chest wall: No tenderness.  Abdominal:     General: Abdomen is flat. There is no distension.     Palpations: Abdomen is soft. There is no mass.     Tenderness: There is no abdominal tenderness. There is no guarding or rebound.     Hernia: No hernia is present.  Musculoskeletal:     Cervical back: Normal range of motion and neck supple. No rigidity.     Right lower leg: No edema.     Left lower leg: No edema.  Lymphadenopathy:     Cervical: No cervical adenopathy.  Skin:    Coloration: Skin is not jaundiced or pale.     Findings: No bruising, erythema, lesion or rash.  Neurological:     General: No focal deficit present.     Mental Status: She is alert and oriented to person, place, and time. Mental status is at baseline.     Cranial Nerves: No cranial nerve deficit.     Motor: No weakness.     Gait: Gait normal.  Psychiatric:        Mood and Affect: Mood normal.        Behavior: Behavior normal.        Thought Content: Thought content normal.        Judgment: Judgment normal.            Assessment & Plan:  Diabetes mellitus without complication (HCC) - Plan: Hemoglobin A1c, CBC with Differential/Platelet, COMPLETE METABOLIC PANEL WITH GFR, Lipid panel, Protein / Creatinine Ratio, Urine  Hypothyroidism, unspecified type - Plan: TSH  Essential hypertension  Pure hypercholesterolemia I am very happy with her blood pressure.  I will check her TSH to ensure adequate dosage of levothyroxine.  Monitor her A1c to ensure adequate treatment with Mounjaro.  If elevated I would recommend adding  [place].  Offer the patient my condolences in regards to her mother.  Her immunizations  are up-to-date.  I will check her cholesterol.  Goal LDL is less than 100

## 2023-04-16 LAB — CBC WITH DIFFERENTIAL/PLATELET
Absolute Lymphocytes: 2566 {cells}/uL (ref 850–3900)
Absolute Monocytes: 460 {cells}/uL (ref 200–950)
Basophils Absolute: 70 {cells}/uL (ref 0–200)
Basophils Relative: 0.9 %
Eosinophils Absolute: 250 {cells}/uL (ref 15–500)
Eosinophils Relative: 3.2 %
HCT: 45.8 % — ABNORMAL HIGH (ref 35.0–45.0)
Hemoglobin: 14.9 g/dL (ref 11.7–15.5)
MCH: 29 pg (ref 27.0–33.0)
MCHC: 32.5 g/dL (ref 32.0–36.0)
MCV: 89.3 fL (ref 80.0–100.0)
MPV: 11 fL (ref 7.5–12.5)
Monocytes Relative: 5.9 %
Neutro Abs: 4454 {cells}/uL (ref 1500–7800)
Neutrophils Relative %: 57.1 %
Platelets: 254 10*3/uL (ref 140–400)
RBC: 5.13 10*6/uL — ABNORMAL HIGH (ref 3.80–5.10)
RDW: 12.5 % (ref 11.0–15.0)
Total Lymphocyte: 32.9 %
WBC: 7.8 10*3/uL (ref 3.8–10.8)

## 2023-04-16 LAB — COMPLETE METABOLIC PANEL WITH GFR
AG Ratio: 1.5 (calc) (ref 1.0–2.5)
ALT: 13 U/L (ref 6–29)
AST: 12 U/L (ref 10–35)
Albumin: 4.3 g/dL (ref 3.6–5.1)
Alkaline phosphatase (APISO): 75 U/L (ref 37–153)
BUN: 14 mg/dL (ref 7–25)
CO2: 27 mmol/L (ref 20–32)
Calcium: 10.2 mg/dL (ref 8.6–10.4)
Chloride: 101 mmol/L (ref 98–110)
Creat: 0.93 mg/dL (ref 0.50–1.03)
Globulin: 2.9 g/dL (ref 1.9–3.7)
Glucose, Bld: 115 mg/dL — ABNORMAL HIGH (ref 65–99)
Potassium: 3.6 mmol/L (ref 3.5–5.3)
Sodium: 140 mmol/L (ref 135–146)
Total Bilirubin: 0.4 mg/dL (ref 0.2–1.2)
Total Protein: 7.2 g/dL (ref 6.1–8.1)
eGFR: 72 mL/min/{1.73_m2} (ref >=60)

## 2023-04-16 LAB — HEMOGLOBIN A1C
Hgb A1c MFr Bld: 5.8 %{Hb} — ABNORMAL HIGH (ref <5.7)
Mean Plasma Glucose: 120 mg/dL
eAG (mmol/L): 6.6 mmol/L

## 2023-04-16 LAB — LIPID PANEL
Cholesterol: 148 mg/dL (ref <200)
HDL: 38 mg/dL — ABNORMAL LOW (ref >=50)
LDL Cholesterol (Calc): 84 mg/dL
Non-HDL Cholesterol (Calc): 110 mg/dL (ref <130)
Total CHOL/HDL Ratio: 3.9 (calc) (ref <5.0)
Triglycerides: 164 mg/dL — ABNORMAL HIGH (ref <150)

## 2023-04-16 LAB — PROTEIN / CREATININE RATIO, URINE
Creatinine, Urine: 165 mg/dL (ref 20–275)
Protein/Creat Ratio: 42 mg/g{creat} (ref 24–184)
Protein/Creatinine Ratio: 0.042 mg/mg{creat} (ref 0.024–0.184)
Total Protein, Urine: 7 mg/dL (ref 5–24)

## 2023-04-16 LAB — TSH: TSH: 4.87 m[IU]/L — ABNORMAL HIGH (ref 0.40–4.50)

## 2023-04-17 ENCOUNTER — Other Ambulatory Visit: Payer: Self-pay | Admitting: Family Medicine

## 2023-04-17 DIAGNOSIS — E039 Hypothyroidism, unspecified: Secondary | ICD-10-CM

## 2023-04-25 ENCOUNTER — Other Ambulatory Visit: Payer: Self-pay

## 2023-04-25 MED ORDER — LEVOTHYROXINE SODIUM 100 MCG PO TABS: 100.0000 ug | ORAL_TABLET | Freq: Every day | ORAL | 3 refills | Status: DC

## 2023-05-26 ENCOUNTER — Other Ambulatory Visit (HOSPITAL_BASED_OUTPATIENT_CLINIC_OR_DEPARTMENT_OTHER): Payer: Self-pay | Admitting: Obstetrics and Gynecology

## 2023-05-26 DIAGNOSIS — I10 Essential (primary) hypertension: Secondary | ICD-10-CM

## 2023-06-26 ENCOUNTER — Other Ambulatory Visit: Payer: Self-pay | Admitting: Family Medicine

## 2023-08-16 ENCOUNTER — Other Ambulatory Visit: Payer: Self-pay | Admitting: Family Medicine

## 2023-08-17 NOTE — Telephone Encounter (Signed)
 Rx 03/17/23 #90 1RF- too soon Requested Prescriptions  Pending Prescriptions Disp Refills   atorvastatin  (LIPITOR) 20 MG tablet [Pharmacy Med Name: Atorvastatin  Calcium  20 MG Oral Tablet] 90 tablet 3    Sig: TAKE 1 TABLET BY MOUTH ONCE  DAILY     Cardiovascular:  Antilipid - Statins Failed - 08/17/2023  2:08 PM      Failed - Lipid Panel in normal range within the last 12 months    Cholesterol  Date Value Ref Range Status  04/15/2023 148 <200 mg/dL Final   LDL Cholesterol (Calc)  Date Value Ref Range Status  04/15/2023 84 mg/dL (calc) Final    Comment:    Reference range: <100 . Desirable range <100 mg/dL for primary prevention;   <70 mg/dL for patients with CHD or diabetic patients  with > or = 2 CHD risk factors. Aaron Aas LDL-C is now calculated using the Martin-Hopkins  calculation, which is a validated novel method providing  better accuracy than the Friedewald equation in the  estimation of LDL-C.  Melinda Sprawls et al. Erroll Heard. 1610;960(45): 2061-2068  (http://education.[facility].com/faq/FAQ164)    HDL  Date Value Ref Range Status  04/15/2023 38 (L) > OR = 50 mg/dL Final   Triglycerides  Date Value Ref Range Status  04/15/2023 164 (H) <150 mg/dL Final         Passed - Patient is not pregnant      Passed - Valid encounter within last 12 months    Recent Outpatient Visits           4 months ago Diabetes mellitus without complication [facility])   [facility] Austine Lefort, MD   10 months ago Diabetes mellitus without complication [facility])   [facility] Pickard, Cisco Crest, MD   1 year ago Diabetes mellitus without complication [facility])   [place] [facility] Pickard, Cisco Crest, MD

## 2023-08-25 ENCOUNTER — Other Ambulatory Visit: Payer: Self-pay | Admitting: Family Medicine

## 2023-09-09 ENCOUNTER — Other Ambulatory Visit: Payer: PRIVATE HEALTH INSURANCE

## 2023-09-09 DIAGNOSIS — E039 Hypothyroidism, unspecified: Secondary | ICD-10-CM

## 2023-09-09 DIAGNOSIS — E7800 Pure hypercholesterolemia, unspecified: Secondary | ICD-10-CM

## 2023-09-09 DIAGNOSIS — E119 Type 2 diabetes mellitus without complications: Secondary | ICD-10-CM

## 2023-09-09 DIAGNOSIS — I10 Essential (primary) hypertension: Secondary | ICD-10-CM

## 2023-09-10 LAB — COMPLETE METABOLIC PANEL WITHOUT GFR
AG Ratio: 1.5 (calc) (ref 1.0–2.5)
ALT: 11 U/L (ref 6–29)
AST: 12 U/L (ref 10–35)
Albumin: 4.3 g/dL (ref 3.6–5.1)
Alkaline phosphatase (APISO): 75 U/L (ref 37–153)
BUN: 12 mg/dL (ref 7–25)
CO2: 31 mmol/L (ref 20–32)
Calcium: 10.1 mg/dL (ref 8.6–10.4)
Chloride: 101 mmol/L (ref 98–110)
Creat: 0.96 mg/dL (ref 0.50–1.03)
Globulin: 2.9 g/dL (ref 1.9–3.7)
Glucose, Bld: 91 mg/dL (ref 65–99)
Potassium: 3.8 mmol/L (ref 3.5–5.3)
Sodium: 141 mmol/L (ref 135–146)
Total Bilirubin: 0.6 mg/dL (ref 0.2–1.2)
Total Protein: 7.2 g/dL (ref 6.1–8.1)

## 2023-09-10 LAB — CBC WITH DIFFERENTIAL/PLATELET
Absolute Lymphocytes: 2462 {cells}/uL (ref 850–3900)
Absolute Monocytes: 401 {cells}/uL (ref 200–950)
Basophils Absolute: 68 {cells}/uL (ref 0–200)
Basophils Relative: 1 %
Eosinophils Absolute: 218 {cells}/uL (ref 15–500)
Eosinophils Relative: 3.2 %
HCT: 44.3 % (ref 35.0–45.0)
Hemoglobin: 14.8 g/dL (ref 11.7–15.5)
MCH: 29.9 pg (ref 27.0–33.0)
MCHC: 33.4 g/dL (ref 32.0–36.0)
MCV: 89.5 fL (ref 80.0–100.0)
MPV: 10.6 fL (ref 7.5–12.5)
Monocytes Relative: 5.9 %
Neutro Abs: 3652 {cells}/uL (ref 1500–7800)
Neutrophils Relative %: 53.7 %
Platelets: 241 10*3/uL (ref 140–400)
RBC: 4.95 10*6/uL (ref 3.80–5.10)
RDW: 12.6 % (ref 11.0–15.0)
Total Lymphocyte: 36.2 %
WBC: 6.8 10*3/uL (ref 3.8–10.8)

## 2023-09-10 LAB — LIPID PANEL
Cholesterol: 160 mg/dL (ref <200)
HDL: 37 mg/dL — ABNORMAL LOW (ref >=50)
LDL Cholesterol (Calc): 99 mg/dL
Non-HDL Cholesterol (Calc): 123 mg/dL (ref <130)
Total CHOL/HDL Ratio: 4.3 (calc) (ref <5.0)
Triglycerides: 137 mg/dL (ref <150)

## 2023-09-10 LAB — TSH: TSH: 1.96 m[IU]/L (ref 0.40–4.50)

## 2023-09-10 LAB — HEMOGLOBIN A1C
Hgb A1c MFr Bld: 5.5 % (ref <5.7)
Mean Plasma Glucose: 111 mg/dL
eAG (mmol/L): 6.2 mmol/L

## 2023-09-12 ENCOUNTER — Ambulatory Visit: Payer: Self-pay | Admitting: Family Medicine

## 2023-09-16 ENCOUNTER — Telehealth: Payer: Self-pay

## 2023-09-16 ENCOUNTER — Encounter: Payer: Self-pay | Admitting: Family Medicine

## 2023-09-16 ENCOUNTER — Ambulatory Visit (INDEPENDENT_AMBULATORY_CARE_PROVIDER_SITE_OTHER): Payer: PRIVATE HEALTH INSURANCE | Admitting: Family Medicine

## 2023-09-16 VITALS — BP 114/62 | HR 83 | Temp 97.8°F | Ht 63.0 in | Wt 169.8 lb

## 2023-09-16 DIAGNOSIS — Z23 Encounter for immunization: Secondary | ICD-10-CM | POA: Diagnosis not present

## 2023-09-16 DIAGNOSIS — E039 Hypothyroidism, unspecified: Secondary | ICD-10-CM | POA: Diagnosis not present

## 2023-09-16 DIAGNOSIS — Z0001 Encounter for general adult medical examination with abnormal findings: Secondary | ICD-10-CM | POA: Diagnosis not present

## 2023-09-16 DIAGNOSIS — I10 Essential (primary) hypertension: Secondary | ICD-10-CM | POA: Diagnosis not present

## 2023-09-16 DIAGNOSIS — E7800 Pure hypercholesterolemia, unspecified: Secondary | ICD-10-CM | POA: Diagnosis not present

## 2023-09-16 DIAGNOSIS — E118 Type 2 diabetes mellitus with unspecified complications: Secondary | ICD-10-CM

## 2023-09-16 DIAGNOSIS — Z0000 Encounter for general adult medical examination without abnormal findings: Secondary | ICD-10-CM

## 2023-09-16 NOTE — Progress Notes (Signed)
 Subjective:    Patient ID: Samantha Glover, female    DOB: Feb 20, 1966, 58 y.o.   MRN: [id number]  Patient is a very pleasant 58 year old Caucasian female here today for complete physical exam.  Had colonoscopy 7/24 that revealed one polyp and they recommended repeat colonoscopy in 5 years.  She sees her gynecologist who does her Pap smears and mammogram.  She states that she has had these within the last year.  She also sees a dermatologist for an annual skin check.  She is due for a tetanus shot.  The remainder of her vaccines are up-to-date. Immunization History  Administered Date(s) Administered    Astrazeneca Covid-19 Vaccine, Pf, 0.5 Ml Non Us   07/26/2019   Influenza Split 01/24/2017, 12/25/2017, 01/25/2019   Influenza, Seasonal, Injecte, Preservative Fre 02/04/2023   Influenza,inj,Quad PF,6+ Mos 04/17/2013, 01/29/2014, 03/18/2015, 05/24/2016, 02/05/2019, 02/05/2020, 03/19/2020, 03/09/2021, 03/10/2022   Influenza-Unspecified 01/13/2018, 01/24/2021   Janssen (J&J) SARS-COV-2 Vaccination 07/06/2019   PPD Test 11/12/2015   Pneumococcal Conjugate-13 02/08/2019   Pneumococcal-Unspecified 01/25/2019, 01/24/2021   Zoster Recombinant(Shingrix) 08/20/2019, 11/19/2019   Wt Readings from Last 3 Encounters:  09/16/23 169 lb 12.8 oz (77 kg)  11/19/22 167 lb (75.8 kg)  10/20/22 167 lb (75.8 kg)    Lab on 09/09/2023  Component Date Value Ref Range Status   WBC 09/09/2023 6.8  3.8 - 10.8 Thousand/uL Final   RBC 09/09/2023 4.95  3.80 - 5.10 Million/uL Final   Hemoglobin 09/09/2023 14.8  11.7 - 15.5 g/dL Final   HCT 40/98/1191 44.3  35.0 - 45.0 % Final   MCV 09/09/2023 89.5  80.0 - 100.0 fL Final   MCH 09/09/2023 29.9  27.0 - 33.0 pg Final   MCHC 09/09/2023 33.4  32.0 - 36.0 g/dL Final   Comment: For adults, a slight decrease in the calculated MCHC value (in the range of 30 to 32 g/dL) is most likely not clinically significant; however, it should be interpreted with caution in correlation with  other red cell parameters and the patient's clinical condition.    RDW 09/09/2023 12.6  11.0 - 15.0 % Final   Platelets 09/09/2023 241  140 - 400 Thousand/uL Final   MPV 09/09/2023 10.6  7.5 - 12.5 fL Final   Neutro Abs 09/09/2023 3,652  1,500 - 7,800 cells/uL Final   Absolute Lymphocytes 09/09/2023 2,462  850 - 3,900 cells/uL Final   Absolute Monocytes 09/09/2023 401  200 - 950 cells/uL Final   Eosinophils Absolute 09/09/2023 218  15 - 500 cells/uL Final   Basophils Absolute 09/09/2023 68  0 - 200 cells/uL Final   Neutrophils Relative % 09/09/2023 53.7  % Final   Total Lymphocyte 09/09/2023 36.2  % Final   Monocytes Relative 09/09/2023 5.9  % Final   Eosinophils Relative 09/09/2023 3.2  % Final   Basophils Relative 09/09/2023 1.0  % Final   Glucose, Bld 09/09/2023 91  65 - 99 mg/dL Final   Comment: .            Fasting reference interval .    BUN 09/09/2023 12  7 - 25 mg/dL Final   Creat 47/82/9562 0.96  0.50 - 1.03 mg/dL Final   BUN/Creatinine Ratio 09/09/2023 SEE NOTE:  6 - 22 (calc) Final   Comment:    Not Reported: BUN and Creatinine are within    reference range. .    Sodium 09/09/2023 141  135 - 146 mmol/L Final   Potassium 09/09/2023 3.8  3.5 - 5.3 mmol/L Final  Chloride 09/09/2023 101  98 - 110 mmol/L Final   CO2 09/09/2023 31  20 - 32 mmol/L Final   Calcium  09/09/2023 10.1  8.6 - 10.4 mg/dL Final   Total Protein 16/01/9603 7.2  6.1 - 8.1 g/dL Final   Albumin 54/12/8117 4.3  3.6 - 5.1 g/dL Final   Globulin 14/78/2956 2.9  1.9 - 3.7 g/dL (calc) Final   AG Ratio 09/09/2023 1.5  1.0 - 2.5 (calc) Final   Total Bilirubin 09/09/2023 0.6  0.2 - 1.2 mg/dL Final   Alkaline phosphatase (APISO) 09/09/2023 75  37 - 153 U/L Final   AST 09/09/2023 12  10 - 35 U/L Final   ALT 09/09/2023 11  6 - 29 U/L Final   Hgb A1c MFr Bld 09/09/2023 5.5  <5.7 % Final   Comment: For the purpose of screening for the presence of diabetes: . <5.7%       Consistent with the absence of  diabetes 5.7-6.4%    Consistent with increased risk for diabetes             (prediabetes) > or =6.5%  Consistent with diabetes . This assay result is consistent with a decreased risk of diabetes. . Currently, no consensus exists regarding use of hemoglobin A1c for diagnosis of diabetes in children. . According to American Diabetes Association (ADA) guidelines, hemoglobin A1c <7.0% represents optimal control in non-pregnant diabetic patients. Different metrics may apply to specific patient populations.  Standards of Medical Care in Diabetes(ADA). .    Mean Plasma Glucose 09/09/2023 111  mg/dL Final   eAG (mmol/L) 21/30/8657 6.2  mmol/L Final   Cholesterol 09/09/2023 160  <200 mg/dL Final   HDL 84/69/6295 37 (L)  > OR = 50 mg/dL Final   Triglycerides 28/41/3244 137  <150 mg/dL Final   LDL Cholesterol (Calc) 09/09/2023 99  mg/dL (calc) Final   Comment: Reference range: <100 . Desirable range <100 mg/dL for primary prevention;   <70 mg/dL for patients with CHD or diabetic patients  with > or = 2 CHD risk factors. Aaron Aas LDL-C is now calculated using the Martin-Hopkins  calculation, which is a validated novel method providing  better accuracy than the Friedewald equation in the  estimation of LDL-C.  Melinda Sprawls et al. Erroll Heard. 0102;725(36): 2061-2068  (http://education.[facility].com/faq/FAQ164)    Total CHOL/HDL Ratio 09/09/2023 4.3  <6.4 (calc) Final   Non-HDL Cholesterol (Calc) 09/09/2023 123  <130 mg/dL (calc) Final   Comment: For patients with diabetes plus 1 major ASCVD risk  factor, treating to a non-HDL-C goal of <100 mg/dL  (LDL-C of <40 mg/dL) is considered a therapeutic  option.    TSH 09/09/2023 1.96  0.40 - 4.50 mIU/L Final    Past Medical History:  Diagnosis Date   ADD (attention deficit disorder)    Diabetes mellitus without complication (HCC)    Hypercholesterolemia    Hypertension    Thyroid disorder    hypothyroidism   Past Surgical History:   Procedure Laterality Date   BREAST SURGERY Left    lipoma removed   CERVIX SURGERY     ablation   COLONOSCOPY     EYE SURGERY     Lasik   Current Outpatient Medications on File Prior to Visit  Medication Sig Dispense Refill   atorvastatin  (LIPITOR) 20 MG tablet TAKE 1 TABLET BY MOUTH ONCE  DAILY 90 tablet 1   hydrochlorothiazide  (HYDRODIURIL ) 25 MG tablet TAKE 1 TABLET (25 MG TOTAL) BY MOUTH DAILY. 90 tablet 1   levothyroxine  (  SYNTHROID ) 100 MCG tablet Take 1 tablet (100 mcg total) by mouth daily. 90 tablet 3   losartan  (COZAAR ) 50 MG tablet TAKE 1 TABLET BY MOUTH DAILY 90 tablet 3   ondansetron  (ZOFRAN ) 4 MG tablet Take 1 tablet (4 mg total) by mouth every 8 (eight) hours as needed for nausea or vomiting. 20 tablet 1   tirzepatide  (MOUNJARO ) 15 MG/0.5ML Pen Inject 15 mg into the skin once a week. 6 mL 1   No current facility-administered medications on file prior to visit.   No Known Allergies Social History   Socioeconomic History   Marital status: Married    Spouse name: Not on file   Number of children: Not on file   Years of education: Not on file   Highest education level: Associate degree: occupational, Scientist, product/process development, or vocational program  Occupational History   Not on file  Tobacco Use   Smoking status: Never   Smokeless tobacco: Never  Vaping Use   Vaping status: Never Used  Substance and Sexual Activity   Alcohol use: No   Drug use: No   Sexual activity: Yes    Birth control/protection: Surgical    Comment: married.  2 sons.  Currently, stay at home mom.  Other Topics Concern   Not on file  Social History Narrative   Not on file   Social Drivers of Health   Financial Resource Strain: Low Risk  (09/15/2023)   Overall Financial Resource Strain (CARDIA)    Difficulty of Paying Living Expenses: Not hard at all  Food Insecurity: No Food Insecurity (09/15/2023)   Hunger Vital Sign    Worried About Running Out of Food in the Last Year: Never true    Ran Out of  Food in the Last Year: Never true  Transportation Needs: No Transportation Needs (09/15/2023)   PRAPARE - Administrator, Civil Service (Medical): No    Lack of Transportation (Non-Medical): No  Physical Activity: Insufficiently Active (09/15/2023)   Exercise Vital Sign    Days of Exercise per Week: 3 days    Minutes of Exercise per Session: 30 min  Stress: No Stress Concern Present (09/15/2023)   [employer] of Occupational Health - Occupational Stress Questionnaire    Feeling of Stress : Only a little  Social Connections: Socially Integrated (09/15/2023)   Social Connection and Isolation Panel [NHANES]    Frequency of Communication with Friends and Family: Twice a week    Frequency of Social Gatherings with Friends and Family: Once a week    Attends Religious Services: More than 4 times per year    Active Member of [employer] or Organizations: Yes    Attends Banker Meetings: 1 to 4 times per year    Marital Status: Married  Catering manager Violence: Not on file      Review of Systems  All other systems reviewed and are negative.      Objective:   Physical Exam Vitals reviewed.  Constitutional:      General: She is not in acute distress.    Appearance: Normal appearance. She is obese. She is not ill-appearing or toxic-appearing.  HENT:     Head: Normocephalic and atraumatic.     Right Ear: Tympanic membrane and ear canal normal.     Left Ear: Tympanic membrane and ear canal normal.     Nose: Nose normal. No congestion or rhinorrhea.     Mouth/Throat:     Mouth: Mucous membranes are moist.  Pharynx: No oropharyngeal exudate or posterior oropharyngeal erythema.  Eyes:     General: No scleral icterus.       Right eye: No discharge.        Left eye: No discharge.     Extraocular Movements: Extraocular movements intact.     Pupils: Pupils are equal, round, and reactive to light.  Neck:     Vascular: No carotid bruit.  Cardiovascular:     Rate  and Rhythm: Normal rate and regular rhythm.     Pulses: Normal pulses.     Heart sounds: Normal heart sounds. No murmur heard.    No friction rub. No gallop.  Pulmonary:     Effort: Pulmonary effort is normal. No respiratory distress.     Breath sounds: Normal breath sounds. No stridor. No wheezing, rhonchi or rales.  Chest:     Chest wall: No tenderness.  Abdominal:     General: Abdomen is flat. There is no distension.     Palpations: Abdomen is soft. There is no mass.     Tenderness: There is no abdominal tenderness. There is no guarding or rebound.     Hernia: No hernia is present.  Musculoskeletal:     Cervical back: Normal range of motion and neck supple. No rigidity.     Right lower leg: No edema.     Left lower leg: No edema.  Lymphadenopathy:     Cervical: No cervical adenopathy.  Skin:    Coloration: Skin is not jaundiced or pale.     Findings: No bruising, erythema, lesion or rash.  Neurological:     General: No focal deficit present.     Mental Status: She is alert and oriented to person, place, and time. Mental status is at baseline.     Cranial Nerves: No cranial nerve deficit.     Motor: No weakness.     Gait: Gait normal.  Psychiatric:        Mood and Affect: Mood normal.        Behavior: Behavior normal.        Thought Content: Thought content normal.        Judgment: Judgment normal.            Assessment & Plan:  General medical exam  Hypothyroidism, unspecified type  Essential hypertension  Pure hypercholesterolemia  Controlled type 2 diabetes mellitus with complication, without long-term current use of insulin  (HCC) Patient's weight loss remains stable.  We discussed weaning down on Mounjaro  however she is doing well right now and prefers to stay on the current dose.  If she decides to stop the medication, because of her diabetes, I would recommend weaning down 50% just to monitor her sugars for any rebound.  Her A1c is outstanding at 5.5.  I am  very proud of her for this.  Blood pressure is excellent.  If it remains this low, she can discontinue hydrochlorothiazide .  I have asked her to watch this.  Cholesterol is acceptable.  Mammogram and colonoscopy are up-to-date.  She received her tetanus shot today.

## 2023-09-16 NOTE — Addendum Note (Signed)
 Addended by: Gillermo Lack K on: 09/16/2023 10:41 AM   Modules accepted: Orders

## 2023-09-16 NOTE — Telephone Encounter (Signed)
 Copied from CRM [id number]. Topic: Medical Record Request - Other >> Sep 16, 2023  8:44 AM Hassie Lint wrote: Reason for CRM: Patient states she was just at the office and Dr Cheril Cork told her to let the office know prior to leaving to request info from her OBGYN in regards to her last office visit and mammogram. Dr Merryl Abraham [address] [place], [place], [place] [address] [phone]. Patient left office and forgot to advise.  Patient can be reached [phone] for more information

## 2023-10-11 ENCOUNTER — Other Ambulatory Visit: Payer: Self-pay | Admitting: Family Medicine

## 2023-10-11 DIAGNOSIS — E119 Type 2 diabetes mellitus without complications: Secondary | ICD-10-CM

## 2024-01-05 ENCOUNTER — Other Ambulatory Visit: Payer: Self-pay | Admitting: Family Medicine

## 2024-01-23 ENCOUNTER — Encounter: Payer: Self-pay | Admitting: Family Medicine

## 2024-01-24 ENCOUNTER — Other Ambulatory Visit: Payer: Self-pay | Admitting: Family Medicine

## 2024-01-24 MED ORDER — TIRZEPATIDE 12.5 MG/0.5ML ~~LOC~~ SOAJ: 12.5000 mg | SUBCUTANEOUS | 0 refills | Status: AC

## 2024-02-17 ENCOUNTER — Ambulatory Visit (INDEPENDENT_AMBULATORY_CARE_PROVIDER_SITE_OTHER): Payer: PRIVATE HEALTH INSURANCE

## 2024-02-17 DIAGNOSIS — Z23 Encounter for immunization: Secondary | ICD-10-CM

## 2024-02-17 NOTE — Progress Notes (Signed)
 Patient is in office today for a nurse visit for Flu Vaccine Immunization. Patient Injection was given in the  Left deltoid. Patient tolerated injection well.

## 2024-02-21 ENCOUNTER — Other Ambulatory Visit: Payer: Self-pay | Admitting: Family Medicine

## 2024-02-21 DIAGNOSIS — E119 Type 2 diabetes mellitus without complications: Secondary | ICD-10-CM

## 2024-02-21 DIAGNOSIS — R112 Nausea with vomiting, unspecified: Secondary | ICD-10-CM

## 2024-04-05 ENCOUNTER — Other Ambulatory Visit: Payer: Self-pay | Admitting: Family Medicine

## 2024-05-16 ENCOUNTER — Other Ambulatory Visit: Payer: Self-pay | Admitting: Family Medicine

## 2024-09-14 ENCOUNTER — Other Ambulatory Visit: Payer: PRIVATE HEALTH INSURANCE

## 2024-09-21 ENCOUNTER — Encounter: Payer: PRIVATE HEALTH INSURANCE | Admitting: Family Medicine
